# Patient Record
Sex: Female | Born: 1985 | Race: White | Hispanic: No | Marital: Married | Smoking: Never smoker
Health system: Southern US, Community
[De-identification: ages and names within clinical notes are randomized; demographics above are authoritative.]

## PROBLEM LIST (undated history)

## (undated) DIAGNOSIS — E039 Hypothyroidism, unspecified: Secondary | ICD-10-CM

## (undated) DIAGNOSIS — J029 Acute pharyngitis, unspecified: Secondary | ICD-10-CM

## (undated) DIAGNOSIS — F32A Depression, unspecified: Secondary | ICD-10-CM

## (undated) DIAGNOSIS — J329 Chronic sinusitis, unspecified: Secondary | ICD-10-CM

## (undated) DIAGNOSIS — O139 Gestational [pregnancy-induced] hypertension without significant proteinuria, unspecified trimester: Secondary | ICD-10-CM

## (undated) DIAGNOSIS — Z8759 Personal history of other complications of pregnancy, childbirth and the puerperium: Secondary | ICD-10-CM

## (undated) HISTORY — DX: Personal history of other complications of pregnancy, childbirth and the puerperium: Z87.59

## (undated) HISTORY — DX: Gestational (pregnancy-induced) hypertension without significant proteinuria, unspecified trimester: O13.9

## (undated) HISTORY — DX: Acute pharyngitis, unspecified: J02.9

## (undated) HISTORY — DX: Chronic sinusitis, unspecified: J32.9

## (undated) HISTORY — PX: TONSILLECTOMY: SUR1361

## (undated) HISTORY — PX: OTHER SURGICAL HISTORY: SHX169

---
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
## 2007-02-25 ENCOUNTER — Ambulatory Visit: Payer: Self-pay | Admitting: Family Medicine

## 2007-02-25 DIAGNOSIS — J029 Acute pharyngitis, unspecified: Secondary | ICD-10-CM | POA: Insufficient documentation

## 2007-02-25 DIAGNOSIS — J019 Acute sinusitis, unspecified: Secondary | ICD-10-CM | POA: Insufficient documentation

## 2007-02-25 DIAGNOSIS — Z9189 Other specified personal risk factors, not elsewhere classified: Secondary | ICD-10-CM | POA: Insufficient documentation

## 2007-02-25 LAB — CONVERTED CEMR LAB: Rapid Strep: NEGATIVE

## 2010-03-17 ENCOUNTER — Encounter: Payer: Self-pay | Admitting: Family Medicine

## 2010-03-28 ENCOUNTER — Ambulatory Visit (INDEPENDENT_AMBULATORY_CARE_PROVIDER_SITE_OTHER): Payer: 59 | Admitting: Family Medicine

## 2010-03-28 ENCOUNTER — Encounter: Payer: Self-pay | Admitting: Family Medicine

## 2010-03-28 VITALS — BP 90/60 | HR 90 | Temp 98.2°F | Wt 133.0 lb

## 2010-03-28 DIAGNOSIS — K219 Gastro-esophageal reflux disease without esophagitis: Secondary | ICD-10-CM

## 2010-03-28 DIAGNOSIS — N949 Unspecified condition associated with female genital organs and menstrual cycle: Secondary | ICD-10-CM

## 2010-03-28 DIAGNOSIS — E875 Hyperkalemia: Secondary | ICD-10-CM

## 2010-03-28 DIAGNOSIS — R102 Pelvic and perineal pain: Secondary | ICD-10-CM

## 2010-03-28 LAB — BASIC METABOLIC PANEL
BUN: 8 mg/dL (ref 6–23)
CO2: 26 mEq/L (ref 19–32)
Calcium: 8.7 mg/dL (ref 8.4–10.5)
Chloride: 105 mEq/L (ref 96–112)
Creatinine, Ser: 0.6 mg/dL (ref 0.4–1.2)
GFR: 122.93 mL/min (ref >60.00)
Glucose, Bld: 81 mg/dL (ref 70–99)
Potassium: 4 mEq/L (ref 3.5–5.1)
Sodium: 138 mEq/L (ref 135–145)

## 2010-03-28 MED ORDER — OMEPRAZOLE 40 MG PO CPDR: 40.0000 mg | DELAYED_RELEASE_CAPSULE | Freq: Every day | ORAL | Status: DC

## 2010-03-28 NOTE — Progress Notes (Signed)
  Subjective:    Patient ID: Jill Willis, female    DOB: 1985-03-28, 25 y.o.   MRN: [id number]  HPI 25 yr old female to re-establish with [place] after an absence of 3 years and to discuss several issues. 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 Also she asks for a rx for a GERD medication, since she has had more heartburn and reflux symptoms lately. OTC Prilosec helps somewhat. Finally she describes mild LLQ pelvic pains that happen for one or two days out of the month. These seem to come in between her menses each month. Her menses are normal. No problems with BMs or urinations. These have occurred off and on for the past 4 years. She has never had a pelvic exam or Pap smear. Her LMP was 2 weeks ago, and the last of these pains was 4 weeks ago.   Review of Systems  Constitutional: Negative.   Respiratory: Negative.   Cardiovascular: Negative.   Gastrointestinal: Negative.   Genitourinary: Positive for pelvic pain.       Objective:   Physical Exam  Constitutional: She appears well-developed and well-nourished.  Cardiovascular: Normal rate, regular rhythm, normal heart sounds and intact distal pulses.   Pulmonary/Chest: Effort normal and breath sounds normal.  Abdominal: Soft. Bowel sounds are normal. She exhibits no distension and no mass. There is no tenderness. There is no rebound and no guarding.          Assessment & Plan:  The elevated potassium level is probably spurious, but we will recheck a BMET today. I instructed our lab techs to avoid using a tourniquet. We will try 40 mg a day of Omeprazole. The pains she is having are most likely ovulation pains, given the pattern she describes. Use Motrin prn. She does need to be getting yearly GYN exams now, so she will set this up with a GYN of her choice soon.

## 2010-03-31 ENCOUNTER — Telehealth: Payer: Self-pay | Admitting: Family Medicine

## 2010-03-31 ENCOUNTER — Telehealth: Payer: Self-pay

## 2010-03-31 NOTE — Telephone Encounter (Signed)
Pt notified of lab results

## 2010-03-31 NOTE — Telephone Encounter (Signed)
Pt needs blood work results °

## 2010-03-31 NOTE — Telephone Encounter (Signed)
Pt aware of lab results 

## 2010-03-31 NOTE — Telephone Encounter (Signed)
Message copied by Madison Hickman on Thu Mar 31, 2010 12:55 PM ------      Message from: Dwaine Deter      Created: Tue Mar 29, 2010  8:30 AM       Normal, including the potassium

## 2010-04-05 ENCOUNTER — Encounter: Payer: Self-pay | Admitting: Family Medicine

## 2010-05-25 ENCOUNTER — Encounter: Payer: Self-pay | Admitting: Family Medicine

## 2010-05-25 ENCOUNTER — Ambulatory Visit (INDEPENDENT_AMBULATORY_CARE_PROVIDER_SITE_OTHER): Payer: 59 | Admitting: Family Medicine

## 2010-05-25 VITALS — BP 100/62 | HR 78 | Temp 98.3°F | Resp 14 | Wt 134.0 lb

## 2010-05-25 DIAGNOSIS — L509 Urticaria, unspecified: Secondary | ICD-10-CM

## 2010-05-25 NOTE — Progress Notes (Signed)
  Subjective:    Patient ID: Jill Willis, female    DOB: 09-Jun-1985, 25 y.o.   MRN: [id number]  HPI Here for hives which started about one week ago. At first she only had some mild itching over the body that would come and go, but thst suddenly worsened yesterday morning. She developed intense burning and itching in her skin and widespread whelps and wheals over her body that would come and go. No tongue swelling, no wheezing or SOB. She saw the PA at her work yesterday who prescribed a Medrol dose pack and Hydroxyzine. This has helped a lot, and today the visible eruptions are all gone. She still has some mild itching here an there. She denies any new meds or foods, detergents, lotions, etc. She has already gotten an appt with an allergist for a few weeks from now.    Review of Systems  Constitutional: Negative.   Respiratory: Negative.   Cardiovascular: Negative.   Skin: Positive for rash.       Objective:   Physical Exam  Constitutional: She appears well-developed and well-nourished.  Cardiovascular: Normal rate, regular rhythm, normal heart sounds and intact distal pulses.   Pulmonary/Chest: Effort normal and breath sounds normal.  Skin: Skin is warm and dry. No rash noted. No erythema. No pallor.          Assessment & Plan:  This is classic hives. Finish out the Medrol dose pack and instead of Hydroxyzine, I suggested taking Zyrtec daily. Add Zantac OTC bid .

## 2012-03-26 ENCOUNTER — Ambulatory Visit (INDEPENDENT_AMBULATORY_CARE_PROVIDER_SITE_OTHER): Payer: 59 | Admitting: Physician Assistant

## 2012-03-26 VITALS — BP 118/72 | HR 96 | Temp 98.8°F | Resp 17 | Ht 63.5 in | Wt 134.0 lb

## 2012-03-26 DIAGNOSIS — J029 Acute pharyngitis, unspecified: Secondary | ICD-10-CM

## 2012-03-26 LAB — POCT RAPID STREP A (OFFICE): Rapid Strep A Screen: NEGATIVE

## 2012-03-26 NOTE — Progress Notes (Signed)
   [address] [place] [address]   Phone [phone]  Subjective:    Patient ID: Jill Willis, female    DOB: 04/04/1985, 27 y.o.   MRN: [id number]  HPI Pt presents to clinic with sore throat since Sunday early morning.  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  They have several cases of strep at work.  She feels like she has razor blades in her throat.  She is not having any other cold symptoms other than a slight cough once in a while.  She has taken a low dose of Motrin which did help a little with her pain.     Review of Systems  Constitutional: Negative for fever and chills.  HENT: Positive for sore throat. Negative for congestion, rhinorrhea and postnasal drip.   Respiratory: Positive for cough.   Gastrointestinal: Negative for nausea, vomiting and diarrhea.  Musculoskeletal: Negative for myalgias.  Neurological: Negative for headaches.       Objective:   Physical Exam  Vitals reviewed. Constitutional: She is oriented to person, place, and time. She appears well-developed and well-nourished.  HENT:  Head: Normocephalic and atraumatic.  Right Ear: Hearing, tympanic membrane, external ear and ear canal normal.  Left Ear: Hearing, tympanic membrane, external ear and ear canal normal.  Nose: Mucosal edema (red) present.  Mouth/Throat: Uvula is midline and mucous membranes are normal. Posterior oropharyngeal erythema (mild - uvula seems slight swollen and erytheamtous) present. No posterior oropharyngeal edema.  Eyes: Conjunctivae are normal.  Neck: Neck supple.  Cardiovascular: Normal rate, regular rhythm and normal heart sounds.   No murmur heard. Pulmonary/Chest: Effort normal and breath sounds normal.  Lymphadenopathy:    She has cervical adenopathy (AC enlarged but not TTP).  Neurological: She is alert and oriented to person, place, and time.  Skin: Skin is warm and dry.  Psychiatric: She has a normal mood and affect. Her behavior is normal. Judgment and thought  content normal.   Results for orders placed in visit on 03/26/12  POCT RAPID STREP A (OFFICE)      Result Value Range   Rapid Strep A Screen Negative  Negative        Assessment & Plan:  Acute pharyngitis - This appears to be a viral pharyngitis.  She will start Mucinex because she is starting to clear her throat more as the night has gone one.  She will increase her use of Motrin to 800mg  tid for pain.  She will rest and stay out of work tonight and plan on RTW tomorrow.  Plan: POCT rapid strep A

## 2012-03-27 ENCOUNTER — Ambulatory Visit: Payer: 59 | Admitting: Family Medicine

## 2012-03-27 DIAGNOSIS — Z0289 Encounter for other administrative examinations: Secondary | ICD-10-CM

## 2012-08-27 ENCOUNTER — Ambulatory Visit (INDEPENDENT_AMBULATORY_CARE_PROVIDER_SITE_OTHER): Payer: 59 | Admitting: Family Medicine

## 2012-08-27 VITALS — BP 118/72 | HR 84 | Temp 98.2°F | Resp 18 | Ht 62.75 in | Wt 130.8 lb

## 2012-08-27 DIAGNOSIS — J309 Allergic rhinitis, unspecified: Secondary | ICD-10-CM

## 2012-08-27 DIAGNOSIS — J019 Acute sinusitis, unspecified: Secondary | ICD-10-CM

## 2012-08-27 MED ORDER — AMOXICILLIN-POT CLAVULANATE 875-125 MG PO TABS: 1.0000 | ORAL_TABLET | Freq: Two times a day (BID) | ORAL | Status: DC

## 2012-08-27 NOTE — Progress Notes (Signed)
[facility] [address], [place] [address] [phone]  Date:  08/27/2012   Name:  Jill Willis   DOB:  [birth year]   MRN:  [id number]  PCP:  Nelwyn Salisbury, MD    Chief Complaint: Sinusitis   History of Present Illness:  Jill Willis is a 26 y.o. very pleasant female patient who presents with the following:  She is here today with illness.  She has noted sx for about 5 days.  She has noted sinus congestion, sinus pain,nasal  drainage, sore throat.  She does have some sneezing, mild cough.  She is not coughing up any material No fever, chills or aches, no SI symptmos.  LMP 07/30/12  She is generally healthy except she is prone to sinus problems.   She is about to return to full- time school to get her business degree.  Had been working as a Agricultural engineer.  Never a smoker  Patient Active Problem List   Diagnosis Date Noted  . CHICKENPOX, HX OF 02/25/2007    Past Medical History  Diagnosis Date  . Sinusitis   . Chickenpox   . Sore throat     Past Surgical History  Procedure Laterality Date  . Tonsillectomy      History  Substance Use Topics  . Smoking status: Never Smoker   . Smokeless tobacco: Not on file  . Alcohol Use: No    Family History  Problem Relation Age of Onset  . Hypothyroidism Mother   . Diabetes Paternal Grandfather     No Known Allergies  Medication list has been reviewed and updated.  Current Outpatient Prescriptions on File Prior to Visit  Medication Sig Dispense Refill  . norgestimate-ethinyl estradiol (ORTHO-CYCLEN,SPRINTEC,PREVIFEM) 0.25-35 MG-MCG tablet Take 1 tablet by mouth daily.       No current facility-administered medications on file prior to visit.    Review of Systems:  As per HPI- otherwise negative.   Physical Examination: Filed Vitals:   08/27/12 0820  BP: 118/72  Pulse: 84  Temp: 98.2 F (36.8 C)  Resp: 18   Filed Vitals:   08/27/12 0820  Height: 5' 2.75" (1.594 m)   Weight: 130 lb 12.8 oz (59.33 kg)   Body mass index is 23.35 kg/(m^2). Ideal Body Weight: Weight in (lb) to have BMI = 25: 139.7  GEN: WDWN, NAD, Non-toxic, A & O x 3, looks well HEENT: Atraumatic, Normocephalic. Neck supple. No masses, No LAD.  Bilateral TM wnl, oropharynx normal.  PEERL,EOMI.   Nasal cavity is congested, sinus tenderness  Ears and Nose: No external deformity. CV: RRR, No M/G/R. No JVD. No thrill. No extra heart sounds. PULM: CTA B, no wheezes, crackles, rhonchi. No retractions. No resp. distress. No accessory muscle use. ABD: S, NT, ND EXTR: No c/c/e NEURO Normal gait.  PSYCH: Normally interactive. Conversant. Not depressed or anxious appearing.  Calm demeanor.    Assessment and Plan: Sinusitis, acute - Plan: amoxicillin-clavulanate (AUGMENTIN) 875-125 MG per tablet  Allergic rhinitis  Sinusitis: treat with augmentin.  Advised this can interfere with her OCP.   She has zyrtec which she uses sometimes- advised to use regularly for the next few weeks Let me know if not better- Sooner if worse.     Signed Abbe Amsterdam, MD

## 2012-08-27 NOTE — Patient Instructions (Addendum)
Use the antibiotic as directed.  If you are not better in the next few days please let us know. Use your zyrtec for at least the next few weeks, and don't forget an antibiotics can interfere with your oral contraceptive pill.

## 2012-10-12 ENCOUNTER — Ambulatory Visit (INDEPENDENT_AMBULATORY_CARE_PROVIDER_SITE_OTHER): Payer: 59 | Admitting: Family Medicine

## 2012-10-12 VITALS — BP 108/70 | HR 85 | Temp 98.2°F | Resp 16 | Ht 62.5 in | Wt 129.0 lb

## 2012-10-12 DIAGNOSIS — N898 Other specified noninflammatory disorders of vagina: Secondary | ICD-10-CM

## 2012-10-12 DIAGNOSIS — N760 Acute vaginitis: Secondary | ICD-10-CM

## 2012-10-12 DIAGNOSIS — B373 Candidiasis of vulva and vagina: Secondary | ICD-10-CM

## 2012-10-12 DIAGNOSIS — B9689 Other specified bacterial agents as the cause of diseases classified elsewhere: Secondary | ICD-10-CM

## 2012-10-12 LAB — POCT WET PREP WITH KOH
KOH Prep POC: POSITIVE
Trichomonas, UA: NEGATIVE
Yeast Wet Prep HPF POC: POSITIVE

## 2012-10-12 MED ORDER — METRONIDAZOLE 500 MG PO TABS: 500.0000 mg | ORAL_TABLET | Freq: Two times a day (BID) | ORAL | Status: DC

## 2012-10-12 MED ORDER — FLUCONAZOLE 150 MG PO TABS: 150.0000 mg | ORAL_TABLET | Freq: Once | ORAL | Status: DC

## 2012-10-12 NOTE — Progress Notes (Signed)
This chart was scribed for Norberto Sorenson, MD, by Yevette Edwards, ED Scribe. The  patient's care was started at 1:54 PM.  Subjective:    Patient ID: Jill Willis, female    DOB: 28-Jul-1985, 27 y.o.   MRN: [id number] Chief Complaint  Patient presents with  . Vaginal Discharge    * 5 days    Vaginal Discharge The patient's primary symptoms include a vaginal discharge. The patient's pertinent negatives include no pelvic pain. This is a new problem. The current episode started in the past 7 days. The problem has been unchanged. The patient is experiencing no pain. She is not pregnant. Pertinent negatives include no back pain, chills, constipation, diarrhea, dysuria, fever or vomiting. The vaginal discharge was malodorous, yellow and thin. She is sexually active. She uses oral contraceptives for contraception.    HPI Comments: Jill Willis is a 27 y.o. female who presents to the [facility] complaining of vaginal discharge which has been occurring for approximately four days and has an associated fishy ordor. She describes the discharge as "yellow and runny". She denies any dysuria, vaginal pain, or genital sores. She also denies any fever, chills, back pain, diarrhea, or constipation. The pt is sexually active, but she reports that her last sexual intercourse was "a while."  She uses birth control.   The pt had a GYN appointment five days ago, prior to the onset of symptoms.  Past Medical History  Diagnosis Date  . Sinusitis   . Chickenpox   . Sore throat    Current Outpatient Prescriptions on File Prior to Visit  Medication Sig Dispense Refill  . norgestimate-ethinyl estradiol (ORTHO-CYCLEN,SPRINTEC,PREVIFEM) 0.25-35 MG-MCG tablet Take 1 tablet by mouth daily.      Marland Kitchen amoxicillin-clavulanate (AUGMENTIN) 875-125 MG per tablet Take 1 tablet by mouth 2 (two) times daily.  20 tablet  0   No current facility-administered medications on file prior to visit.   No Known Allergies  VS BP 108/70   Pulse 85  Temp(Src) 98.2 F (36.8 C) (Oral)  Resp 16  Ht 5' 2.5" (1.588 m)  Wt 129 lb (58.514 kg)  BMI 23.2 kg/m2  SpO2 100%  LMP 10/01/2012   Review of Systems  Constitutional: Negative for fever and chills.  Gastrointestinal: Negative for vomiting, diarrhea and constipation.  Genitourinary: Positive for vaginal discharge. Negative for dysuria, vaginal pain and pelvic pain.  Musculoskeletal: Negative for back pain.       Objective:   Physical Exam  Nursing note and vitals reviewed. Constitutional: She is oriented to person, place, and time. She appears well-developed and well-nourished. No distress.  HENT:  Head: Normocephalic and atraumatic.  Eyes: EOM are normal.  Neck: Neck supple. No tracheal deviation present.  Cardiovascular: Normal rate.   Pulmonary/Chest: Effort normal. No respiratory distress.  Genitourinary: There is no rash, tenderness, lesion or injury on the right labia. There is no rash, tenderness, lesion or injury on the left labia. Uterus is not deviated, not enlarged, not fixed and not tender. Cervix exhibits discharge. Cervix exhibits no motion tenderness and no friability. Right adnexum displays no mass, no tenderness and no fullness. Left adnexum displays no mass, no tenderness and no fullness. Vaginal discharge found.  Moderate amount of thin, white discharge with an amine order.   Musculoskeletal: Normal range of motion.  Neurological: She is alert and oriented to person, place, and time.  Skin: Skin is warm and dry.  Psychiatric: She has a normal mood and affect. Her behavior is normal.  Assessment & Plan:  Vaginal discharge - Plan: POCT Wet Prep with KOH, GC/Chlamydia Probe Amp  Bacterial vaginosis  Vaginal moniliasis  Meds ordered this encounter  Medications  . fluconazole (DIFLUCAN) 150 MG tablet    Sig: Take 1 tablet (150 mg total) by mouth once. Repeat if needed    Dispense:  2 tablet    Refill:  0  . metroNIDAZOLE (FLAGYL) 500 MG  tablet    Sig: Take 1 tablet (500 mg total) by mouth 2 (two) times daily with a meal. DO NOT CONSUME ALCOHOL WHILE TAKING THIS MEDICATION.    Dispense:  14 tablet    Refill:  0

## 2012-10-12 NOTE — Patient Instructions (Signed)
Monilial Vaginitis Vaginitis in a soreness, swelling and redness (inflammation) of the vagina and vulva. Monilial vaginitis is not a sexually transmitted infection. CAUSES  Yeast vaginitis is caused by yeast (candida) that is normally found in your vagina. With a yeast infection, the candida has overgrown in number to a point that upsets the chemical balance. SYMPTOMS   White, thick vaginal discharge.  Swelling, itching, redness and irritation of the vagina and possibly the lips of the vagina (vulva).  Burning or painful urination.  Painful intercourse. DIAGNOSIS  Things that may contribute to monilial vaginitis are:  Postmenopausal and virginal states.  Pregnancy.  Infections.  Being tired, sick or stressed, especially if you had monilial vaginitis in the past.  Diabetes. Good control will help lower the chance.  Birth control pills.  Tight fitting garments.  Using bubble bath, feminine sprays, douches or deodorant tampons.  Taking certain medications that kill germs (antibiotics).  Sporadic recurrence can occur if you become ill. TREATMENT  Your caregiver will give you medication.  There are several kinds of anti monilial vaginal creams and suppositories specific for monilial vaginitis. For recurrent yeast infections, use a suppository or cream in the vagina 2 times a week, or as directed.  Anti-monilial or steroid cream for the itching or irritation of the vulva may also be used. Get your caregiver's permission.  Painting the vagina with methylene blue solution may help if the monilial cream does not work.  Eating yogurt may help prevent monilial vaginitis. HOME CARE INSTRUCTIONS   Finish all medication as prescribed.  Do not have sex until treatment is completed or after your caregiver tells you it is okay.  Take warm sitz baths.  Do not douche.  Do not use tampons, especially scented ones.  Wear cotton underwear.  Avoid tight pants and panty  hose.  Tell your sexual partner that you have a yeast infection. They should go to their caregiver if they have symptoms such as mild rash or itching.  Your sexual partner should be treated as well if your infection is difficult to eliminate.  Practice safer sex. Use condoms.  Some vaginal medications cause latex condoms to fail. Vaginal medications that harm condoms are:  Cleocin cream.  Butoconazole (Femstat).  Terconazole (Terazol) vaginal suppository.  Miconazole (Monistat) (may be purchased over the counter). SEEK MEDICAL CARE IF:   You have a temperature by mouth above 102 F (38.9 C).  The infection is getting worse after 2 days of treatment.  The infection is not getting better after 3 days of treatment.  You develop blisters in or around your vagina.  You develop vaginal bleeding, and it is not your menstrual period.  You have pain when you urinate.  You develop intestinal problems.  You have pain with sexual intercourse. Document Released: 09/28/2004 Document Revised: 03/13/2011 Document Reviewed: 06/12/2008 ExitCare Patient Information 2014 [employer]. Bacterial Vaginosis Bacterial vaginosis (BV) is a vaginal infection where the normal balance of bacteria in the vagina is disrupted. The normal balance is then replaced by an overgrowth of certain bacteria. There are several different kinds of bacteria that can cause BV. BV is the most common vaginal infection in women of childbearing age. CAUSES   The cause of BV is not fully understood. BV develops when there is an increase or imbalance of harmful bacteria.  Some activities or behaviors can upset the normal balance of bacteria in the vagina and put women at increased risk including:  Having a new sex partner or multiple   sex partners.  Douching.  Using an intrauterine device (IUD) for contraception.  It is not clear what role sexual activity plays in the development of BV. However, women that have  never had sexual intercourse are rarely infected with BV. Women do not get BV from toilet seats, bedding, swimming pools or from touching objects around them.  SYMPTOMS   Grey vaginal discharge.  A fish-like odor with discharge, especially after sexual intercourse.  Itching or burning of the vagina and vulva.  Burning or pain with urination.  Some women have no signs or symptoms at all. DIAGNOSIS  Your caregiver must examine the vagina for signs of BV. Your caregiver will perform lab tests and look at the sample of vaginal fluid through a microscope. They will look for bacteria and abnormal cells (clue cells), a pH test higher than 4.5, and a positive amine test all associated with BV.  RISKS AND COMPLICATIONS   Pelvic inflammatory disease (PID).  Infections following gynecology surgery.  Developing HIV.  Developing herpes virus. TREATMENT  Sometimes BV will clear up without treatment. However, all women with symptoms of BV should be treated to avoid complications, especially if gynecology surgery is planned. Female partners generally do not need to be treated. However, BV may spread between female sex partners so treatment is helpful in preventing a recurrence of BV.   BV may be treated with antibiotics. The antibiotics come in either pill or vaginal cream forms. Either can be used with nonpregnant or pregnant women, but the recommended dosages differ. These antibiotics are not harmful to the baby.  BV can recur after treatment. If this happens, a second round of antibiotics will often be prescribed.  Treatment is important for pregnant women. If not treated, BV can cause a premature delivery, especially for a pregnant woman who had a premature birth in the past. All pregnant women who have symptoms of BV should be checked and treated.  For chronic reoccurrence of BV, treatment with a type of prescribed gel vaginally twice a week is helpful. HOME CARE INSTRUCTIONS   Finish all  medication as directed by your caregiver.  Do not have sex until treatment is completed.  Tell your sexual partner that you have a vaginal infection. They should see their caregiver and be treated if they have problems, such as a mild rash or itching.  Practice safe sex. Use condoms. Only have 1 sex partner. PREVENTION  Basic prevention steps can help reduce the risk of upsetting the natural balance of bacteria in the vagina and developing BV:  Do not have sexual intercourse (be abstinent).  Do not douche.  Use all of the medicine prescribed for treatment of BV, even if the signs and symptoms go away.  Tell your sex partner if you have BV. That way, they can be treated, if needed, to prevent reoccurrence. SEEK MEDICAL CARE IF:   Your symptoms are not improving after 3 days of treatment.  You have increased discharge, pain, or fever. MAKE SURE YOU:   Understand these instructions.  Will watch your condition.  Will get help right away if you are not doing well or get worse. FOR MORE INFORMATION  Division of STD Prevention (DSTDP), Centers for Disease Control and Prevention: www.cdc.gov/std American Social Health Association (ASHA): www.ashastd.org  Document Released: 12/19/2004 Document Revised: 03/13/2011 Document Reviewed: 06/11/2008 ExitCare Patient Information 2014 [employer].  

## 2012-10-15 ENCOUNTER — Encounter: Payer: Self-pay | Admitting: Family Medicine

## 2013-04-02 ENCOUNTER — Ambulatory Visit (HOSPITAL_BASED_OUTPATIENT_CLINIC_OR_DEPARTMENT_OTHER): Admission: RE | Admit: 2013-04-02 | Payer: 59 | Source: Ambulatory Visit | Admitting: Otolaryngology

## 2013-04-02 ENCOUNTER — Encounter (HOSPITAL_BASED_OUTPATIENT_CLINIC_OR_DEPARTMENT_OTHER): Admission: RE | Payer: Self-pay | Source: Ambulatory Visit

## 2013-04-02 SURGERY — SEPTOPLASTY, NOSE, WITH NASAL TURBINATE REDUCTION
Anesthesia: General

## 2013-07-03 ENCOUNTER — Ambulatory Visit (INDEPENDENT_AMBULATORY_CARE_PROVIDER_SITE_OTHER): Payer: BC Managed Care – PPO | Admitting: Family Medicine

## 2013-07-03 VITALS — BP 114/82 | HR 78 | Temp 98.6°F | Resp 18 | Ht 66.0 in | Wt 130.8 lb

## 2013-07-03 DIAGNOSIS — H109 Unspecified conjunctivitis: Secondary | ICD-10-CM

## 2013-07-03 MED ORDER — MOXIFLOXACIN HCL (2X DAY) 0.5 % OP SOLN: 1.0000 [drp] | Freq: Two times a day (BID) | OPHTHALMIC | Status: DC

## 2013-07-03 MED ORDER — POLYMYXIN B-TRIMETHOPRIM 10000-0.1 UNIT/ML-% OP SOLN: 1.0000 [drp] | OPHTHALMIC | Status: DC

## 2013-07-03 NOTE — Patient Instructions (Signed)
It appears that you have conjunctivitis (pink-eye). Use the moxeza drops twice a day for one week.  However if this is too expensive use the polytrim rx instead.  Let me know if your eye is not better in the next 1-2 days- Sooner if worse.

## 2013-07-03 NOTE — Progress Notes (Signed)
[facility] [address], [place] [address] [phone]  Date:  07/03/2013   Name:  Jill Willis   DOB:  [birth year]   MRN:  [id number]  PCP:  Nelwyn SalisburyFRY,STEPHEN A, MD    Chief Complaint: eye pain   History of Present Illness:  Jill Willis is a 28 y.o. very pleasant female patient who presents with the following:  She noted a problem with her right eye a few hours ago.  It just started bothering her a few hours ago.  It does not feel like there is anything in her eye.  She has noted some blurriness.  She did not have any discharge this am, but noted onset of green discharge from the eye this afternoon The left eye is ok.   She does not wear corrective lenses.    She is generally healthy   Patient Active Problem List   Diagnosis Date Noted  . CHICKENPOX, HX OF 02/25/2007    Past Medical History  Diagnosis Date  . Sinusitis   . Chickenpox   . Sore throat     Past Surgical History  Procedure Laterality Date  . Tonsillectomy      History  Substance Use Topics  . Smoking status: Never Smoker   . Smokeless tobacco: Not on file  . Alcohol Use: No    Family History  Problem Relation Age of Onset  . Hypothyroidism Mother   . Diabetes Paternal Grandfather     No Known Allergies  Medication list has been reviewed and updated.  Current Outpatient Prescriptions on File Prior to Visit  Medication Sig Dispense Refill  . norgestimate-ethinyl estradiol (ORTHO-CYCLEN,SPRINTEC,PREVIFEM) 0.25-35 MG-MCG tablet Take 1 tablet by mouth daily.      . fluconazole (DIFLUCAN) 150 MG tablet Take 1 tablet (150 mg total) by mouth once. Repeat if needed  2 tablet  0  . metroNIDAZOLE (FLAGYL) 500 MG tablet Take 1 tablet (500 mg total) by mouth 2 (two) times daily with a meal. DO NOT CONSUME ALCOHOL WHILE TAKING THIS MEDICATION.  14 tablet  0   No current facility-administered medications on file prior to visit.    Review of Systems:  As per HPI-  otherwise negative.   Physical Examination: Filed Vitals:   07/03/13 2038  BP: 114/82  Pulse: 78  Temp: 98.6 F (37 C)  Resp: 18   Filed Vitals:   07/03/13 2038  Height: 5\' 6"  (1.676 m)  Weight: 130 lb 12.8 oz (59.33 kg)   Body mass index is 21.12 kg/(m^2). Ideal Body Weight: Weight in (lb) to have BMI = 25: 154.6  GEN: WDWN, NAD, Non-toxic, A & O x 3, looks well HEENT: Atraumatic, Normocephalic. Neck supple. No masses, No LAD.  Bilateral TM wnl, oropharynx normal.  PEERL,EOMI.   Right eye is slightly injected with a small amount of green discharge.  Negative fluorescin stain Ears and Nose: No external deformity. CV: RRR, No M/G/R. No JVD. No thrill. No extra heart sounds. PULM: CTA B, no wheezes, crackles, rhonchi. No retractions. No resp. distress. No accessory muscle use. EXTR: No c/c/e NEURO Normal gait.  PSYCH: Normally interactive. Conversant. Not depressed or anxious appearing.  Calm demeanor.   Assessment and Plan: Conjunctivitis, right eye - Plan: Moxifloxacin HCl 0.5 % SOLN, trimethoprim-polymyxin b (POLYTRIM) ophthalmic solution  Treat for conjunctivitis with moxeza- if too expensive she can use polytrim.    Signed Abbe AmsterdamJessica Copland, MD

## 2014-12-03 ENCOUNTER — Ambulatory Visit (INDEPENDENT_AMBULATORY_CARE_PROVIDER_SITE_OTHER): Payer: BLUE CROSS/BLUE SHIELD | Admitting: Family Medicine

## 2014-12-03 ENCOUNTER — Encounter: Payer: Self-pay | Admitting: Family Medicine

## 2014-12-03 VITALS — BP 115/82 | HR 75 | Temp 99.1°F | Ht 66.0 in | Wt 139.0 lb

## 2014-12-03 DIAGNOSIS — G43809 Other migraine, not intractable, without status migrainosus: Secondary | ICD-10-CM | POA: Diagnosis not present

## 2014-12-03 LAB — HEPATIC FUNCTION PANEL
ALBUMIN: 4.6 g/dL (ref 3.5–5.2)
ALT: 14 U/L (ref 0–35)
AST: 16 U/L (ref 0–37)
Alkaline Phosphatase: 66 U/L (ref 39–117)
BILIRUBIN TOTAL: 0.6 mg/dL (ref 0.2–1.2)
Bilirubin, Direct: 0.1 mg/dL (ref 0.0–0.3)
Total Protein: 6.7 g/dL (ref 6.0–8.3)

## 2014-12-03 LAB — BASIC METABOLIC PANEL
BUN: 10 mg/dL (ref 6–23)
CALCIUM: 9.5 mg/dL (ref 8.4–10.5)
CO2: 28 mEq/L (ref 19–32)
CREATININE: 0.69 mg/dL (ref 0.40–1.20)
Chloride: 105 mEq/L (ref 96–112)
GFR: 108.44 mL/min (ref >60.00)
GLUCOSE: 85 mg/dL (ref 70–99)
Potassium: 4.5 mEq/L (ref 3.5–5.1)
Sodium: 141 mEq/L (ref 135–145)

## 2014-12-03 LAB — CBC WITH DIFFERENTIAL/PLATELET
Basophils Absolute: 0 10*3/uL (ref 0.0–0.1)
Basophils Relative: 0.4 % (ref 0.0–3.0)
EOS PCT: 0.3 % (ref 0.0–5.0)
Eosinophils Absolute: 0 10*3/uL (ref 0.0–0.7)
HCT: 43.2 % (ref 36.0–46.0)
HEMOGLOBIN: 14.3 g/dL (ref 12.0–15.0)
Lymphocytes Relative: 26.5 % (ref 12.0–46.0)
Lymphs Abs: 1.9 10*3/uL (ref 0.7–4.0)
MCHC: 33.1 g/dL (ref 30.0–36.0)
MCV: 92 fl (ref 78.0–100.0)
MONOS PCT: 5.7 % (ref 3.0–12.0)
Monocytes Absolute: 0.4 10*3/uL (ref 0.1–1.0)
Neutro Abs: 4.8 10*3/uL (ref 1.4–7.7)
Neutrophils Relative %: 67.1 % (ref 43.0–77.0)
Platelets: 233 10*3/uL (ref 150.0–400.0)
RBC: 4.7 Mil/uL (ref 3.87–5.11)
RDW: 13.9 % (ref 11.5–15.5)
WBC: 7.2 10*3/uL (ref 4.0–10.5)

## 2014-12-03 LAB — TSH: TSH: 0.61 u[IU]/mL (ref 0.35–4.50)

## 2014-12-03 MED ORDER — SUMATRIPTAN SUCCINATE 100 MG PO TABS: 100.0000 mg | ORAL_TABLET | ORAL | Status: DC | PRN

## 2014-12-03 NOTE — Progress Notes (Signed)
   Subjective:    Patient ID: Jill Willis, female    DOB: Aug 16, 1985, 29 y.o.   MRN: [id number]  HPI 29 yr old female to re-establish with us and to discuss frequent headaches. These started about 6 months ago and they have become more frequent. They seem to be related to the start of each menstrual cycle, beginning about 2 days before she starts and lasting 2-4 days. These are centered over the forehead. They are uncomfortable but not severe. She has some light sensitivity with them, and she can be nauseated at times. No visual changes, no other neurologic symptoms. She has tried Ibuprofen and Excedrin Migraine with no relief. She had been taking Ortho Tricyclen for several years but she stopped taking this 4 months ago. Her menses are somewhat irregular now and they last about 3-4 days each. She recently got married but her husband has had a vasectomy, so she does not need to take BCP for contraception. Of note her mother has had migraines since she was in her  3520s.   Review of Systems  Constitutional: Negative.   Eyes: Negative.   Respiratory: Negative.   Cardiovascular: Negative.   Neurological: Positive for headaches. Negative for dizziness, tremors, seizures, syncope, facial asymmetry, speech difficulty, weakness, light-headedness and numbness.       Objective:   Physical Exam  Constitutional: She is oriented to person, place, and time. She appears well-developed and well-nourished.  HENT:  Head: Normocephalic and atraumatic.  Right Ear: External ear normal.  Left Ear: External ear normal.  Nose: Nose normal.  Mouth/Throat: Oropharynx is clear and moist.  Eyes: Conjunctivae and EOM are normal. Pupils are equal, round, and reactive to light.  Neck: Neck supple. No JVD present. No tracheal deviation present. No thyromegaly present.  Cardiovascular: Normal rate, regular rhythm, normal heart sounds and intact distal pulses.   Pulmonary/Chest: Effort normal and breath sounds  normal.  Lymphadenopathy:    She has no cervical adenopathy.  Neurological: She is alert and oriented to person, place, and time. She has normal reflexes. No cranial nerve deficit. She exhibits normal muscle tone. Coordination normal.          Assessment & Plan:  These sound like perimenstrual migraines. She will try Imitrex as needed. We reviewed dietary measures to avoid. She has not seen a GYN for several years so she made an appt to establish with Dr. Claiborne Billingsallahan next month.

## 2014-12-03 NOTE — Progress Notes (Signed)
Pre visit review using our clinic review tool, if applicable. No additional management support is needed unless otherwise documented below in the visit note. 

## 2015-11-03 DIAGNOSIS — Z6827 Body mass index (BMI) 27.0-27.9, adult: Secondary | ICD-10-CM | POA: Diagnosis not present

## 2015-11-03 DIAGNOSIS — Z01419 Encounter for gynecological examination (general) (routine) without abnormal findings: Secondary | ICD-10-CM | POA: Diagnosis not present

## 2015-12-14 DIAGNOSIS — R05 Cough: Secondary | ICD-10-CM | POA: Diagnosis not present

## 2015-12-14 DIAGNOSIS — J0100 Acute maxillary sinusitis, unspecified: Secondary | ICD-10-CM | POA: Diagnosis not present

## 2015-12-14 DIAGNOSIS — T3695XA Adverse effect of unspecified systemic antibiotic, initial encounter: Secondary | ICD-10-CM | POA: Diagnosis not present

## 2015-12-14 DIAGNOSIS — B379 Candidiasis, unspecified: Secondary | ICD-10-CM | POA: Diagnosis not present

## 2016-05-03 DIAGNOSIS — J988 Other specified respiratory disorders: Secondary | ICD-10-CM | POA: Diagnosis not present

## 2016-05-03 DIAGNOSIS — H66002 Acute suppurative otitis media without spontaneous rupture of ear drum, left ear: Secondary | ICD-10-CM | POA: Diagnosis not present

## 2016-05-03 DIAGNOSIS — J3489 Other specified disorders of nose and nasal sinuses: Secondary | ICD-10-CM | POA: Diagnosis not present

## 2016-05-15 DIAGNOSIS — Z6826 Body mass index (BMI) 26.0-26.9, adult: Secondary | ICD-10-CM | POA: Diagnosis not present

## 2016-05-15 DIAGNOSIS — Z3162 Encounter for fertility preservation counseling: Secondary | ICD-10-CM | POA: Diagnosis not present

## 2016-08-03 ENCOUNTER — Encounter: Payer: Self-pay | Admitting: Family Medicine

## 2016-08-03 ENCOUNTER — Ambulatory Visit (INDEPENDENT_AMBULATORY_CARE_PROVIDER_SITE_OTHER): Payer: BLUE CROSS/BLUE SHIELD | Admitting: Family Medicine

## 2016-08-03 VITALS — BP 106/84 | HR 78 | Temp 98.4°F | Ht 66.0 in | Wt 141.0 lb

## 2016-08-03 DIAGNOSIS — Z0000 Encounter for general adult medical examination without abnormal findings: Secondary | ICD-10-CM

## 2016-08-03 LAB — POC URINALSYSI DIPSTICK (AUTOMATED)
Bilirubin, UA: NEGATIVE
Blood, UA: NEGATIVE
CLARITY UA: NEGATIVE
Glucose, UA: NEGATIVE
KETONES UA: NEGATIVE
Leukocytes, UA: NEGATIVE
Nitrite, UA: NEGATIVE
Protein, UA: NEGATIVE
SPEC GRAV UA: 1.015 (ref 1.010–1.025)
UROBILINOGEN UA: 0.2 U/dL
pH, UA: 7 (ref 5.0–8.0)

## 2016-08-03 LAB — BASIC METABOLIC PANEL
BUN: 7 mg/dL (ref 6–23)
CALCIUM: 9.1 mg/dL (ref 8.4–10.5)
CHLORIDE: 104 meq/L (ref 96–112)
CO2: 29 meq/L (ref 19–32)
CREATININE: 0.69 mg/dL (ref 0.40–1.20)
GFR: 107.13 mL/min (ref >60.00)
GLUCOSE: 92 mg/dL (ref 70–99)
Potassium: 4.3 mEq/L (ref 3.5–5.1)
Sodium: 139 mEq/L (ref 135–145)

## 2016-08-03 LAB — CBC WITH DIFFERENTIAL/PLATELET
BASOS PCT: 0.7 % (ref 0.0–3.0)
Basophils Absolute: 0 10*3/uL (ref 0.0–0.1)
Eosinophils Absolute: 0 10*3/uL (ref 0.0–0.7)
Eosinophils Relative: 0.3 % (ref 0.0–5.0)
HCT: 38.5 % (ref 36.0–46.0)
HEMOGLOBIN: 12.7 g/dL (ref 12.0–15.0)
Lymphocytes Relative: 26.4 % (ref 12.0–46.0)
Lymphs Abs: 1.8 10*3/uL (ref 0.7–4.0)
MCHC: 33 g/dL (ref 30.0–36.0)
MCV: 93.3 fl (ref 78.0–100.0)
MONO ABS: 0.5 10*3/uL (ref 0.1–1.0)
Monocytes Relative: 6.5 % (ref 3.0–12.0)
NEUTROS ABS: 4.6 10*3/uL (ref 1.4–7.7)
Neutrophils Relative %: 66.1 % (ref 43.0–77.0)
Platelets: 224 10*3/uL (ref 150.0–400.0)
RBC: 4.13 Mil/uL (ref 3.87–5.11)
RDW: 13.8 % (ref 11.5–15.5)
WBC: 7 10*3/uL (ref 4.0–10.5)

## 2016-08-03 LAB — HEPATIC FUNCTION PANEL
ALBUMIN: 4.4 g/dL (ref 3.5–5.2)
ALT: 16 U/L (ref 0–35)
AST: 19 U/L (ref 0–37)
Alkaline Phosphatase: 57 U/L (ref 39–117)
BILIRUBIN DIRECT: 0.1 mg/dL (ref 0.0–0.3)
TOTAL PROTEIN: 6.1 g/dL (ref 6.0–8.3)
Total Bilirubin: 0.6 mg/dL (ref 0.2–1.2)

## 2016-08-03 LAB — LIPID PANEL
Cholesterol: 195 mg/dL (ref 0–200)
HDL: 67.3 mg/dL (ref >39.00)
LDL Cholesterol: 118 mg/dL — ABNORMAL HIGH (ref 0–99)
NONHDL: 127.85
TRIGLYCERIDES: 51 mg/dL (ref 0.0–149.0)
Total CHOL/HDL Ratio: 3
VLDL: 10.2 mg/dL (ref 0.0–40.0)

## 2016-08-03 LAB — TSH: TSH: 0.58 u[IU]/mL (ref 0.35–4.50)

## 2016-08-03 NOTE — Progress Notes (Signed)
   Subjective:    Patient ID: Jill Willis, female    DOB: 07-02-1985, 31 y.o.   MRN: [id number]  HPI Here for a well exam. She feels fine. Her headaches have improved and she no longer uses Sumatriptan.    Review of Systems  Constitutional: Negative.   HENT: Negative.   Eyes: Negative.   Respiratory: Negative.   Cardiovascular: Negative.   Gastrointestinal: Negative.   Genitourinary: Negative for decreased urine volume, difficulty urinating, dyspareunia, dysuria, enuresis, flank pain, frequency, hematuria, pelvic pain and urgency.  Musculoskeletal: Negative.   Skin: Negative.   Neurological: Negative.   Psychiatric/Behavioral: Negative.        Objective:   Physical Exam  Constitutional: She is oriented to person, place, and time. She appears well-developed and well-nourished. No distress.  HENT:  Head: Normocephalic and atraumatic.  Right Ear: External ear normal.  Left Ear: External ear normal.  Nose: Nose normal.  Mouth/Throat: Oropharynx is clear and moist. No oropharyngeal exudate.  Eyes: Pupils are equal, round, and reactive to light. Conjunctivae and EOM are normal. No scleral icterus.  Neck: Normal range of motion. Neck supple. No JVD present. No thyromegaly present.  Cardiovascular: Normal rate, regular rhythm, normal heart sounds and intact distal pulses.  Exam reveals no gallop and no friction rub.   No murmur heard. Pulmonary/Chest: Effort normal and breath sounds normal. No respiratory distress. She has no wheezes. She has no rales. She exhibits no tenderness.  Abdominal: Soft. Bowel sounds are normal. She exhibits no distension and no mass. There is no tenderness. There is no rebound and no guarding.  Musculoskeletal: Normal range of motion. She exhibits no edema or tenderness.  Lymphadenopathy:    She has no cervical adenopathy.  Neurological: She is alert and oriented to person, place, and time. She has normal reflexes. No cranial nerve deficit. She  exhibits normal muscle tone. Coordination normal.  Skin: Skin is warm and dry. No rash noted. No erythema.  Psychiatric: She has a normal mood and affect. Her behavior is normal. Judgment and thought content normal.          Assessment & Plan:  Well exam. We discussed diet and exercise. Get fasting labs.  Gershon CraneStephen Yacine Droz, MD

## 2016-08-03 NOTE — Patient Instructions (Signed)
WE NOW OFFER   [facility] FAST TRACK!!!  SAME DAY Appointments for ACUTE CARE  Such as: Sprains, Injuries, cuts, abrasions, rashes, muscle pain, joint pain, back pain Colds, flu, sore throats, headache, allergies, cough, fever  Ear pain, sinus and eye infections Abdominal pain, nausea, vomiting, diarrhea, upset stomach Animal/insect bites  3 Easy Ways to Schedule: Walk-In Scheduling Call in scheduling Mychart Sign-up: [email or web address]         

## 2016-09-13 ENCOUNTER — Ambulatory Visit (INDEPENDENT_AMBULATORY_CARE_PROVIDER_SITE_OTHER): Payer: BLUE CROSS/BLUE SHIELD | Admitting: Orthopaedic Surgery

## 2016-09-13 ENCOUNTER — Ambulatory Visit (INDEPENDENT_AMBULATORY_CARE_PROVIDER_SITE_OTHER): Payer: BLUE CROSS/BLUE SHIELD

## 2016-09-13 ENCOUNTER — Ambulatory Visit (INDEPENDENT_AMBULATORY_CARE_PROVIDER_SITE_OTHER): Payer: BLUE CROSS/BLUE SHIELD | Admitting: Family Medicine

## 2016-09-13 ENCOUNTER — Encounter: Payer: Self-pay | Admitting: Family Medicine

## 2016-09-13 VITALS — BP 105/89 | HR 83 | Temp 98.4°F | Ht 66.0 in | Wt 141.0 lb

## 2016-09-13 DIAGNOSIS — M79674 Pain in right toe(s): Secondary | ICD-10-CM

## 2016-09-13 MED ORDER — METHYLPREDNISOLONE ACETATE 40 MG/ML IJ SUSP
40.0000 mg | INTRAMUSCULAR | Status: AC | PRN
  Administered 2016-09-13: 40 mg via INTRAMUSCULAR

## 2016-09-13 MED ORDER — LIDOCAINE HCL 1 % IJ SOLN
1.0000 mL | INTRAMUSCULAR | Status: AC | PRN
  Administered 2016-09-13: 1 mL

## 2016-09-13 NOTE — Progress Notes (Signed)
Office Visit Note   Patient: Jill Willis           Date of Birth: 16-May-1985           MRN: [id number] Visit Date: 09/13/2016              Requested by: Jill SalisburyFry, Stephen A, MD [address] [place], [place] [address] PCP: Jill SalisburyFry, Stephen A, MD   Assessment & Plan: Visit Diagnoses:  1. Pain of right great toe     Plan: Although x-rays did not show hallux rigidus she presents as if this is something that starting to happen. I do want her to wear her flat shoes for the next month or so. I will have her take 600 mg of ibuprofen twice daily for the next week. I agree with her trying Willis steroid injection around the MTP joint without difficulty but mainly in the soft tissues and not likely in the joint itself.. If this does not work I would like to try an intra-articular injection under direct fluoroscopy of the MTP joint. In the office after I did provide an injection she felt significantly better over this will help her at all questions were encouraged and answered and she knows to give us Willis call if this worsens.  Follow-Up Instructions: Return if symptoms worsen or fail to improve.   Orders:  Orders Placed This Encounter  Procedures  . Trigger Point Injection  . XR Toe Great Right   No orders of the defined types were placed in this encounter.     Procedures: Trigger Point Inj Date/Time: 09/13/2016 4:39 PM Performed by: Kathryne HitchBLACKMAN, Ikia Cincotta Y Authorized by: Kathryne HitchBLACKMAN, Donis Pinder Y   Total # of Trigger Points:  1 Location: lower extremity   Medications #1:  1 mL lidocaine 1 %; 40 mg methylPREDNISolone acetate 40 MG/ML     Clinical Data: No additional findings.   Subjective: No chief complaint on file. Patient is Willis very pleasant 31 year old I'm seeing for the first time as Willis patient but I know her and her family well. Her mom has had Willis history of hallux rigidus and had surgery for this. The patient does describe great toe pain that is worsened quite Willis bit over  the last 24 hours but is been bothering her for about 6-8 months now. She points to her right great toe as Willis source of her pain and she points the MTP joint. When she wears flat shoes and firm shoes she has less pain. It affects her worse when going up and down stairs as well. Is again become significantly painful enough today that she wanted be seen.  HPI  Review of Systems She currently denies any headache, chest pain, shortness of breath, fever, chills, nausea, vomiting.  Objective: Vital Signs: There were no vitals taken for this visit.  Physical Exam She is alert or 3 and in no acute distress she is walking though with slight limp. Ortho Exam Examination of her right foot shows normal contours of the foot. There is no swelling or redness around the MTP joint no bunion deformity. She is neurovascular intact. Flexion of the MTP joint of the right great 2 does show severe pain. Specialty Comments:  No specialty comments available.  Imaging: Xr Toe Great Right  Result Date: 09/13/2016 3 views of the right great toe show no acute findings. The alignment of the  MTP joint is near anatomic.    PMFS History: Patient Active Problem List   Diagnosis Date  Noted  . Pain of right great toe 09/13/2016  . CHICKENPOX, HX OF 02/25/2007   Past Medical History:  Diagnosis Date  . Chickenpox   . Sinusitis   . Sore throat     Family History  Problem Relation Age of Onset  . Hypothyroidism Mother   . Diabetes Paternal Grandfather     Past Surgical History:  Procedure Laterality Date  . TONSILLECTOMY     Social History   Occupational History  . Not on file.   Social History Main Topics  . Smoking status: Never Smoker  . Smokeless tobacco: Never Used  . Alcohol use 0.0 oz/week     Comment: very little  . Drug use: No  . Sexual activity: Yes    Birth control/ protection: Pill

## 2016-09-13 NOTE — Progress Notes (Signed)
   Subjective:    Patient ID: Jill BoerKatelyn Gaulden Urwin, female    DOB: August 13, 1985, 31 y.o.   MRN: [id number]  HPI Here for 6 months of intermittent pain at the base of the right great toe. No hx of trauma. Wearing upportive shoes helps. It never swells or gest red or warm. She normally runs for exercise but this has stopped her from running.    Review of Systems  Constitutional: Negative.   Musculoskeletal: Positive for arthralgias. Negative for joint swelling.       Objective:   Physical Exam  Constitutional: She appears well-developed and well-nourished.  Walks with a limp  Musculoskeletal:  The right great toe is very tender over the MTP and there is mild swelling. ROM is full. No crepitus. No erythema or warmth.           Assessment & Plan:  Toe pain, probable capsulitis. Stay off the foot, use ice several times a day, take Ibuprofen prn. We will refer here to Podiatry.  Gershon CraneStephen Jarmel Linhardt, MD

## 2016-09-13 NOTE — Patient Instructions (Signed)
WE NOW OFFER   [facility] FAST TRACK!!!  SAME DAY Appointments for ACUTE CARE  Such as: Sprains, Injuries, cuts, abrasions, rashes, muscle pain, joint pain, back pain Colds, flu, sore throats, headache, allergies, cough, fever  Ear pain, sinus and eye infections Abdominal pain, nausea, vomiting, diarrhea, upset stomach Animal/insect bites  3 Easy Ways to Schedule: Walk-In Scheduling Call in scheduling Mychart Sign-up: [email or web address]         

## 2016-09-15 ENCOUNTER — Ambulatory Visit: Payer: Self-pay | Admitting: Podiatry

## 2016-09-21 ENCOUNTER — Encounter: Payer: Self-pay | Admitting: Family Medicine

## 2016-10-18 DIAGNOSIS — B379 Candidiasis, unspecified: Secondary | ICD-10-CM | POA: Diagnosis not present

## 2016-10-18 DIAGNOSIS — T3695XA Adverse effect of unspecified systemic antibiotic, initial encounter: Secondary | ICD-10-CM | POA: Diagnosis not present

## 2016-10-18 DIAGNOSIS — J0120 Acute ethmoidal sinusitis, unspecified: Secondary | ICD-10-CM | POA: Diagnosis not present

## 2016-11-06 DIAGNOSIS — Z6826 Body mass index (BMI) 26.0-26.9, adult: Secondary | ICD-10-CM | POA: Diagnosis not present

## 2016-11-06 DIAGNOSIS — J0101 Acute recurrent maxillary sinusitis: Secondary | ICD-10-CM | POA: Diagnosis not present

## 2016-11-06 DIAGNOSIS — Z01419 Encounter for gynecological examination (general) (routine) without abnormal findings: Secondary | ICD-10-CM | POA: Diagnosis not present

## 2016-11-24 DIAGNOSIS — H66002 Acute suppurative otitis media without spontaneous rupture of ear drum, left ear: Secondary | ICD-10-CM | POA: Diagnosis not present

## 2016-11-24 DIAGNOSIS — J988 Other specified respiratory disorders: Secondary | ICD-10-CM | POA: Diagnosis not present

## 2016-12-18 DIAGNOSIS — J069 Acute upper respiratory infection, unspecified: Secondary | ICD-10-CM | POA: Diagnosis not present

## 2017-03-09 ENCOUNTER — Other Ambulatory Visit: Payer: Self-pay

## 2017-03-09 ENCOUNTER — Emergency Department (HOSPITAL_COMMUNITY)
Admission: EM | Admit: 2017-03-09 | Discharge: 2017-03-09 | Disposition: A | Discharge status: Home / Self Care | Payer: BLUE CROSS/BLUE SHIELD | Attending: Emergency Medicine | Admitting: Emergency Medicine

## 2017-03-09 ENCOUNTER — Encounter (HOSPITAL_COMMUNITY): Payer: Self-pay | Admitting: Emergency Medicine

## 2017-03-09 ENCOUNTER — Ambulatory Visit: Payer: Self-pay | Admitting: *Deleted

## 2017-03-09 ENCOUNTER — Emergency Department (HOSPITAL_COMMUNITY): Payer: BLUE CROSS/BLUE SHIELD

## 2017-03-09 DIAGNOSIS — R0781 Pleurodynia: Secondary | ICD-10-CM | POA: Diagnosis not present

## 2017-03-09 DIAGNOSIS — Y9389 Activity, other specified: Secondary | ICD-10-CM | POA: Insufficient documentation

## 2017-03-09 DIAGNOSIS — R0789 Other chest pain: Secondary | ICD-10-CM

## 2017-03-09 DIAGNOSIS — Y92410 Unspecified street and highway as the place of occurrence of the external cause: Secondary | ICD-10-CM | POA: Insufficient documentation

## 2017-03-09 DIAGNOSIS — R0602 Shortness of breath: Secondary | ICD-10-CM | POA: Diagnosis not present

## 2017-03-09 DIAGNOSIS — R100 Acute abdomen: Secondary | ICD-10-CM | POA: Diagnosis not present

## 2017-03-09 DIAGNOSIS — S20309A Unspecified superficial injuries of unspecified front wall of thorax, initial encounter: Secondary | ICD-10-CM | POA: Diagnosis present

## 2017-03-09 DIAGNOSIS — R109 Unspecified abdominal pain: Secondary | ICD-10-CM | POA: Diagnosis not present

## 2017-03-09 DIAGNOSIS — S2222XA Fracture of body of sternum, initial encounter for closed fracture: Secondary | ICD-10-CM | POA: Diagnosis not present

## 2017-03-09 DIAGNOSIS — S2220XA Unspecified fracture of sternum, initial encounter for closed fracture: Secondary | ICD-10-CM | POA: Diagnosis not present

## 2017-03-09 DIAGNOSIS — T148XXA Other injury of unspecified body region, initial encounter: Secondary | ICD-10-CM | POA: Diagnosis not present

## 2017-03-09 DIAGNOSIS — Y999 Unspecified external cause status: Secondary | ICD-10-CM | POA: Diagnosis not present

## 2017-03-09 DIAGNOSIS — R519 Headache, unspecified: Secondary | ICD-10-CM

## 2017-03-09 DIAGNOSIS — R51 Headache: Secondary | ICD-10-CM | POA: Insufficient documentation

## 2017-03-09 DIAGNOSIS — S3991XA Unspecified injury of abdomen, initial encounter: Secondary | ICD-10-CM | POA: Diagnosis not present

## 2017-03-09 DIAGNOSIS — S299XXA Unspecified injury of thorax, initial encounter: Secondary | ICD-10-CM | POA: Diagnosis not present

## 2017-03-09 LAB — URINALYSIS, ROUTINE W REFLEX MICROSCOPIC
Bilirubin Urine: NEGATIVE
Glucose, UA: NEGATIVE mg/dL
Ketones, ur: 20 mg/dL — AB
Nitrite: NEGATIVE
Protein, ur: NEGATIVE mg/dL
Specific Gravity, Urine: >1.046 — ABNORMAL HIGH (ref 1.005–1.030)
pH: 5 (ref 5.0–8.0)

## 2017-03-09 LAB — COMPREHENSIVE METABOLIC PANEL
ALT: 16 U/L (ref 14–54)
AST: 24 U/L (ref 15–41)
Albumin: 4 g/dL (ref 3.5–5.0)
Alkaline Phosphatase: 60 U/L (ref 38–126)
Anion gap: 8 (ref 5–15)
BUN: 8 mg/dL (ref 6–20)
CO2: 22 mmol/L (ref 22–32)
Calcium: 8.8 mg/dL — ABNORMAL LOW (ref 8.9–10.3)
Chloride: 111 mmol/L (ref 101–111)
Creatinine, Ser: 0.72 mg/dL (ref 0.44–1.00)
GFR calc Af Amer: >60 mL/min (ref >60)
GFR calc non Af Amer: >60 mL/min (ref >60)
Glucose, Bld: 98 mg/dL (ref 65–99)
Potassium: 4 mmol/L (ref 3.5–5.1)
Sodium: 141 mmol/L (ref 135–145)
Total Bilirubin: 0.6 mg/dL (ref 0.3–1.2)
Total Protein: 6.2 g/dL — ABNORMAL LOW (ref 6.5–8.1)

## 2017-03-09 LAB — CBC
HCT: 41.7 % (ref 36.0–46.0)
Hemoglobin: 13.9 g/dL (ref 12.0–15.0)
MCH: 31 pg (ref 26.0–34.0)
MCHC: 33.3 g/dL (ref 30.0–36.0)
MCV: 93.1 fL (ref 78.0–100.0)
Platelets: 196 10*3/uL (ref 150–400)
RBC: 4.48 MIL/uL (ref 3.87–5.11)
RDW: 13.2 % (ref 11.5–15.5)
WBC: 8.7 10*3/uL (ref 4.0–10.5)

## 2017-03-09 LAB — I-STAT BETA HCG BLOOD, ED (MC, WL, AP ONLY): I-stat hCG, quantitative: <5 m[IU]/mL (ref <5)

## 2017-03-09 MED ORDER — CYCLOBENZAPRINE HCL 10 MG PO TABS: 10.0000 mg | ORAL_TABLET | Freq: Two times a day (BID) | ORAL | 0 refills | Status: DC | PRN

## 2017-03-09 MED ORDER — ACETAMINOPHEN 500 MG PO TABS
1000.0000 mg | ORAL_TABLET | Freq: Once | ORAL | Status: AC
  Administered 2017-03-09: 1000 mg via ORAL
  Filled 2017-03-09: qty 2

## 2017-03-09 MED ORDER — ONDANSETRON HCL 4 MG/2ML IJ SOLN
4.0000 mg | Freq: Once | INTRAMUSCULAR | Status: AC
  Administered 2017-03-09: 4 mg via INTRAVENOUS
  Filled 2017-03-09: qty 2

## 2017-03-09 MED ORDER — TETANUS-DIPHTH-ACELL PERTUSSIS 5-2.5-18.5 LF-MCG/0.5 IM SUSP
0.5000 mL | Freq: Once | INTRAMUSCULAR | Status: AC
Start: 2017-03-09 — End: 2017-03-09
  Administered 2017-03-09: 0.5 mL via INTRAMUSCULAR
  Filled 2017-03-09: qty 0.5

## 2017-03-09 MED ORDER — IOPAMIDOL (ISOVUE-300) INJECTION 61%
INTRAVENOUS | Status: AC
Start: 2017-03-09 — End: 2017-03-09
  Administered 2017-03-09: 100 mL
  Filled 2017-03-09: qty 100

## 2017-03-09 MED ORDER — TRAMADOL HCL 50 MG PO TABS: 50.0000 mg | ORAL_TABLET | Freq: Two times a day (BID) | ORAL | 0 refills | Status: DC | PRN
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
## 2017-03-09 NOTE — Telephone Encounter (Signed)
ED notes and imaging reviewed w/ Dr. Clent RidgesFry. He has concern for possible latent injury or damage that did not show on initial ED eval. Per ED notes, does not appear that pelvic exam was completed. Recommended patient return to ED for re-evaluation. Pt notified of results/instructions and verbalized understanding.
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
## 2017-03-09 NOTE — ED Notes (Signed)
Patient transported to CT 

## 2017-03-09 NOTE — Discharge Instructions (Addendum)
Use the incentive spirometer at least 2-3 times daily to avoid developing a pneumonia.  Alternate 600 mg of ibuprofen and [phone] mg of Tylenol every 3 hours as needed for pain. Do not exceed 4000 mg of Tylenol daily. You may take tramadol as needed for severe pain but do not drive, drink alcohol, or operate heavy machinery on this medication.  You may take Flexeril up to twice daily as needed for muscle spasms. This medication may make you drowsy, so I typically only recommended at night. If this medication makes you drowsy throughout the day, no driving, drinking alcohol, or operating heavy machinery. You may also cut these tablets in half.  Ice to areas of soreness for the next few days and then may move to heat. Do some gentle stretching throughout the day, especially during hot showers or baths. Take short frequent walks and avoid prolonged periods of sitting or laying. Expect to be sore for the next few day and follow up with primary care physician for recheck of ongoing symptoms but return to ER for emergent changing or worsening of symptoms such as severe headache that gets worse, altered mental status/behaving unusually, persistent vomiting, excessive drowsiness, numbness to the arms or legs, unsteady gait, or slurred speech.
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
## 2017-03-13 ENCOUNTER — Ambulatory Visit: Payer: BLUE CROSS/BLUE SHIELD | Admitting: Family Medicine

## 2017-03-13 ENCOUNTER — Encounter: Payer: Self-pay | Admitting: Family Medicine

## 2017-03-13 VITALS — BP 130/80 | HR 92 | Temp 98.7°F | Wt 150.0 lb

## 2017-03-13 DIAGNOSIS — S2220XD Unspecified fracture of sternum, subsequent encounter for fracture with routine healing: Secondary | ICD-10-CM | POA: Diagnosis not present
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
## 2017-03-15 ENCOUNTER — Telehealth: Payer: Self-pay | Admitting: Family Medicine
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
## 2017-03-16 NOTE — Telephone Encounter (Signed)
Called and left a VM for the pt advised her that I am unable to e-mail her work note and I have never tried to send a work note through [employer]. I advised her that I could do a written copy for her to come by any pick up of if she knows her works fax number I could fax it for her. Advised pt to call me back.

## 2017-03-16 NOTE — Telephone Encounter (Signed)
Pt returned call. She will come by to pick up today before the office is closed if possible.

## 2017-03-18 ENCOUNTER — Encounter: Payer: Self-pay | Admitting: Family Medicine

## 2017-03-18 DIAGNOSIS — G44319 Acute post-traumatic headache, not intractable: Secondary | ICD-10-CM

## 2017-03-19 NOTE — Telephone Encounter (Signed)
Note created placed for PCP to sign.

## 2017-03-19 NOTE — Telephone Encounter (Signed)
Note signed. Called pt left VM to advised the work note is ready for pick up. Work note placed up front.

## 2017-03-21 ENCOUNTER — Encounter (INDEPENDENT_AMBULATORY_CARE_PROVIDER_SITE_OTHER): Payer: Self-pay | Admitting: Physician Assistant

## 2017-03-21 ENCOUNTER — Ambulatory Visit (INDEPENDENT_AMBULATORY_CARE_PROVIDER_SITE_OTHER): Payer: BLUE CROSS/BLUE SHIELD | Admitting: Physician Assistant

## 2017-03-21 DIAGNOSIS — S20211A Contusion of right front wall of thorax, initial encounter: Secondary | ICD-10-CM

## 2017-03-21 DIAGNOSIS — S060X0A Concussion without loss of consciousness, initial encounter: Secondary | ICD-10-CM
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
## 2017-03-22 NOTE — Telephone Encounter (Signed)
I ordered a CT scan of the head

## 2017-03-26 ENCOUNTER — Telehealth (INDEPENDENT_AMBULATORY_CARE_PROVIDER_SITE_OTHER): Payer: Self-pay | Admitting: Orthopaedic Surgery

## 2017-03-26 NOTE — Telephone Encounter (Signed)
Can we try to get her in much sooner, like a STAT appointment since Bronson CurbGil is worried about concussion

## 2017-03-26 NOTE — Telephone Encounter (Signed)
Patient called stating that she was able to get in to see the neurologist but not until May 22nd, she says that would be pointless to go to since it's so far out. She was wondering if she could get in sooner somewhere else, or what her other options are. CB # [phone]

## 2017-03-27 ENCOUNTER — Telehealth: Payer: Self-pay

## 2017-03-27 ENCOUNTER — Inpatient Hospital Stay: Admission: RE | Admit: 2017-03-27 | Payer: BLUE CROSS/BLUE SHIELD | Source: Ambulatory Visit
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
## 2017-03-27 NOTE — Telephone Encounter (Signed)
Received call from [facility] which stated they believe pt would be seen sooner if referred to the concussion clinic with Dr. Terrilee FilesZach Smith. [phone]

## 2017-03-27 NOTE — Telephone Encounter (Signed)
I have sent a fax as urgent to [facility] to see if can see pt sooner than May. Pending call back or appt

## 2017-03-28 ENCOUNTER — Ambulatory Visit: Payer: BLUE CROSS/BLUE SHIELD | Admitting: Family Medicine

## 2017-03-28 ENCOUNTER — Encounter: Payer: Self-pay | Admitting: Family Medicine

## 2017-03-28 DIAGNOSIS — S060XAA Concussion with loss of consciousness status unknown, initial encounter: Secondary | ICD-10-CM | POA: Insufficient documentation

## 2017-03-28 DIAGNOSIS — S060X9A Concussion with loss of consciousness of unspecified duration, initial encounter: Secondary | ICD-10-CM | POA: Insufficient documentation

## 2017-03-28 DIAGNOSIS — S060X0A Concussion without loss of consciousness, initial encounter: Secondary | ICD-10-CM | POA: Diagnosis not present

## 2017-03-28 NOTE — Assessment & Plan Note (Signed)
Believe the patient's concussion is very mild.  Has Artie been back to work for the last 2 weeks.  Discussed with patient about icing regimen, home exercise, which activities of doing which wants to avoid.  Patient is to increase activity slowly over the course of next several days.  We discussed over-the-counter medications.  Notably the patient will do well.  Follow-up again though in 2 weeks.  Worsening symptoms repeat testing.
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
## 2017-03-28 NOTE — Telephone Encounter (Signed)
Thank you :)

## 2017-03-28 NOTE — Telephone Encounter (Signed)
Pt has appt this afternoon with Terrilee FilesZach Smith at concussion clinic.

## 2017-03-28 NOTE — Telephone Encounter (Signed)
I called over to [facility] and they will contact pt to schedule appt

## 2017-03-28 NOTE — Patient Instructions (Signed)
Good to see you  I think concussion is mostly gone but still fatigued Adjustable standing desk.  Fish oil 3 gramss daily  Turmeric 500mg  daily  CoQ10 200mg  daily  Iron 65mg  with 500mg  of vitamin C daily for 2 weeks then go to around menstration thereafter See me again in 2 weeks
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
## 2017-04-16 ENCOUNTER — Ambulatory Visit (INDEPENDENT_AMBULATORY_CARE_PROVIDER_SITE_OTHER): Payer: BLUE CROSS/BLUE SHIELD | Admitting: Family Medicine

## 2017-04-16 ENCOUNTER — Encounter: Payer: Self-pay | Admitting: Family Medicine

## 2017-04-16 DIAGNOSIS — S060X0D Concussion without loss of consciousness, subsequent encounter: Secondary | ICD-10-CM | POA: Diagnosis not present

## 2017-04-16 NOTE — Assessment & Plan Note (Addendum)
Patient is nearly symptom-free at this time.  Not taking any prescription medications.  Has only had 2 headaches since last visit 2 weeks ago.  Doing significantly better at this time.  As long as patient is welcome follow-up as needed.

## 2017-05-23 ENCOUNTER — Ambulatory Visit: Payer: BLUE CROSS/BLUE SHIELD | Admitting: Neurology

## 2017-05-23 ENCOUNTER — Encounter

## 2017-09-04 ENCOUNTER — Other Ambulatory Visit: Payer: Self-pay | Admitting: Occupational Medicine

## 2017-09-04 ENCOUNTER — Ambulatory Visit: Payer: Self-pay

## 2017-09-04 DIAGNOSIS — Z0000 Encounter for general adult medical examination without abnormal findings: Secondary | ICD-10-CM

## 2017-10-03 DIAGNOSIS — J321 Chronic frontal sinusitis: Secondary | ICD-10-CM | POA: Diagnosis not present

## 2017-11-08 DIAGNOSIS — Z0000 Encounter for general adult medical examination without abnormal findings: Secondary | ICD-10-CM | POA: Diagnosis not present

## 2017-11-20 DIAGNOSIS — Z01419 Encounter for gynecological examination (general) (routine) without abnormal findings: Secondary | ICD-10-CM | POA: Diagnosis not present

## 2017-11-20 DIAGNOSIS — Z124 Encounter for screening for malignant neoplasm of cervix: Secondary | ICD-10-CM | POA: Diagnosis not present

## 2017-11-20 DIAGNOSIS — Z6827 Body mass index (BMI) 27.0-27.9, adult: Secondary | ICD-10-CM | POA: Diagnosis not present

## 2018-05-23 DIAGNOSIS — Z3162 Encounter for fertility preservation counseling: Secondary | ICD-10-CM | POA: Diagnosis not present

## 2018-11-07 DIAGNOSIS — Z20828 Contact with and (suspected) exposure to other viral communicable diseases: Secondary | ICD-10-CM | POA: Diagnosis not present

## 2018-12-19 DIAGNOSIS — Z6826 Body mass index (BMI) 26.0-26.9, adult: Secondary | ICD-10-CM | POA: Diagnosis not present

## 2018-12-19 DIAGNOSIS — Z01419 Encounter for gynecological examination (general) (routine) without abnormal findings: Secondary | ICD-10-CM | POA: Diagnosis not present

## 2019-06-11 DIAGNOSIS — Z319 Encounter for procreative management, unspecified: Secondary | ICD-10-CM | POA: Diagnosis not present

## 2019-06-11 DIAGNOSIS — N978 Female infertility of other origin: Secondary | ICD-10-CM | POA: Diagnosis not present

## 2019-07-08 ENCOUNTER — Ambulatory Visit: Payer: BC Managed Care – PPO | Admitting: Family Medicine

## 2019-07-08 ENCOUNTER — Encounter: Payer: Self-pay | Admitting: Family Medicine

## 2019-07-08 ENCOUNTER — Other Ambulatory Visit: Payer: Self-pay

## 2019-07-08 VITALS — BP 110/72 | HR 74 | Temp 98.4°F | Ht 62.0 in | Wt 147.0 lb

## 2019-07-08 DIAGNOSIS — R7989 Other specified abnormal findings of blood chemistry: Secondary | ICD-10-CM | POA: Diagnosis not present

## 2019-07-08 LAB — T3, FREE: T3, Free: 2.8 pg/mL (ref 2.3–4.2)

## 2019-07-08 LAB — TSH: TSH: 0.25 u[IU]/mL — ABNORMAL LOW (ref 0.35–4.50)

## 2019-07-08 LAB — T4, FREE: Free T4: 1.06 ng/dL (ref 0.60–1.60)

## 2019-07-08 NOTE — Progress Notes (Signed)
   Subjective:    Patient ID: Jill Willis, female    DOB: 02/07/1985, 34 y.o.   MRN: [id number]  HPI Here to discuss an abnormal TSH result. She and her husband have been trying unsuccessfully to get pregnant for over 2 years, so her GYN, Dr. Claiborne Billings, referred them to the [facility] clinic. At her first visit on June 9 she had basic labs drawn, including a TSH. All these were normal except the TSH was slightly low at 0.272. They then asked her to follow up with her PCP. She feels fine in general. Her weight has been stable.   Review of Systems  Constitutional: Negative.   Respiratory: Negative.   Cardiovascular: Negative.   Endocrine: Negative.   Neurological: Negative.        Objective:   Physical Exam Constitutional:      Appearance: Normal appearance.  Cardiovascular:     Rate and Rhythm: Normal rate and regular rhythm.     Pulses: Normal pulses.     Heart sounds: Normal heart sounds.  Pulmonary:     Effort: Pulmonary effort is normal.     Breath sounds: Normal breath sounds.  Neurological:     Mental Status: She is alert.           Assessment & Plan:  She recently had an abnormal TSH result, so we will send her for a complete thyroid panel today. Follow up accordingly.  Gershon Crane, MD

## 2019-08-13 DIAGNOSIS — Z789 Other specified health status: Secondary | ICD-10-CM | POA: Diagnosis not present

## 2019-08-13 DIAGNOSIS — R946 Abnormal results of thyroid function studies: Secondary | ICD-10-CM | POA: Diagnosis not present

## 2019-08-13 DIAGNOSIS — N943 Premenstrual tension syndrome: Secondary | ICD-10-CM | POA: Diagnosis not present

## 2019-08-13 DIAGNOSIS — R5383 Other fatigue: Secondary | ICD-10-CM | POA: Diagnosis not present

## 2019-08-13 DIAGNOSIS — N979 Female infertility, unspecified: Secondary | ICD-10-CM | POA: Diagnosis not present

## 2019-08-15 DIAGNOSIS — R946 Abnormal results of thyroid function studies: Secondary | ICD-10-CM | POA: Diagnosis not present

## 2019-08-15 DIAGNOSIS — E559 Vitamin D deficiency, unspecified: Secondary | ICD-10-CM | POA: Diagnosis not present

## 2019-08-15 DIAGNOSIS — R5383 Other fatigue: Secondary | ICD-10-CM | POA: Diagnosis not present

## 2019-08-15 DIAGNOSIS — G4700 Insomnia, unspecified: Secondary | ICD-10-CM | POA: Diagnosis not present

## 2019-08-15 DIAGNOSIS — N943 Premenstrual tension syndrome: Secondary | ICD-10-CM | POA: Diagnosis not present

## 2019-09-12 DIAGNOSIS — R5383 Other fatigue: Secondary | ICD-10-CM | POA: Diagnosis not present

## 2019-09-12 DIAGNOSIS — R946 Abnormal results of thyroid function studies: Secondary | ICD-10-CM | POA: Diagnosis not present

## 2019-09-12 DIAGNOSIS — N943 Premenstrual tension syndrome: Secondary | ICD-10-CM | POA: Diagnosis not present

## 2019-09-12 DIAGNOSIS — G4700 Insomnia, unspecified: Secondary | ICD-10-CM | POA: Diagnosis not present

## 2019-11-26 DIAGNOSIS — R7982 Elevated C-reactive protein (CRP): Secondary | ICD-10-CM | POA: Diagnosis not present

## 2019-11-26 DIAGNOSIS — E559 Vitamin D deficiency, unspecified: Secondary | ICD-10-CM | POA: Diagnosis not present

## 2019-11-26 DIAGNOSIS — R946 Abnormal results of thyroid function studies: Secondary | ICD-10-CM | POA: Diagnosis not present

## 2019-11-26 DIAGNOSIS — R5383 Other fatigue: Secondary | ICD-10-CM | POA: Diagnosis not present

## 2019-11-26 DIAGNOSIS — N943 Premenstrual tension syndrome: Secondary | ICD-10-CM | POA: Diagnosis not present

## 2019-12-12 DIAGNOSIS — R946 Abnormal results of thyroid function studies: Secondary | ICD-10-CM | POA: Diagnosis not present

## 2019-12-12 DIAGNOSIS — R5383 Other fatigue: Secondary | ICD-10-CM | POA: Diagnosis not present

## 2019-12-12 DIAGNOSIS — G4700 Insomnia, unspecified: Secondary | ICD-10-CM | POA: Diagnosis not present

## 2019-12-12 DIAGNOSIS — R7982 Elevated C-reactive protein (CRP): Secondary | ICD-10-CM | POA: Diagnosis not present

## 2019-12-17 ENCOUNTER — Encounter: Payer: Self-pay | Admitting: Family Medicine

## 2019-12-17 DIAGNOSIS — R7989 Other specified abnormal findings of blood chemistry: Secondary | ICD-10-CM

## 2019-12-23 DIAGNOSIS — Z6827 Body mass index (BMI) 27.0-27.9, adult: Secondary | ICD-10-CM | POA: Diagnosis not present

## 2019-12-23 DIAGNOSIS — Z01419 Encounter for gynecological examination (general) (routine) without abnormal findings: Secondary | ICD-10-CM | POA: Diagnosis not present

## 2019-12-23 NOTE — Telephone Encounter (Signed)
I did the referral to see Dr. Sharl Ma

## 2020-01-03 NOTE — L&D Delivery Note (Addendum)
Operative Delivery Note At 2:28 PM a viable female was delivered via Vaginal, Spontaneous.  Presentation: vertex; Position: Right,, Occiput,, Anterior; Station: +3.  Verbal consent: obtained from patient.  Risks and benefits discussed in detail.  Risks include, but are not limited to the risks of anesthesia, bleeding, infection, damage to maternal tissues, fetal cephalhematoma.  There is also the risk of inability to effect vaginal delivery of the head, or shoulder dystocia that cannot be resolved by established maneuvers, leading to the need for emergency cesarean section.  APGAR: 7, 9;   Placenta status: intact, to pathology Cord:  3VC with the following complications:nuchal x1 Cord pH: 7.32 V  UOP 350cc via red rubber prior to repair  Anesthesia:  Epidural Instruments: Mity Vac Episiotomy: midline Lacerations: 2nd degree, left vaginal wall Suture Repair: 2.0 3.0 vicryl Est. Blood Loss (mL):  600cc  Mom to postpartum.  Baby to Couplet care / Skin to Skin.  Delivery at 1428, continue Amp/Gent 24hrs. 1g TXA given for initial gush of blood after placenta delivery  Jill Willis Jill Willis 12/19/2020, 3:00 PM

## 2020-03-12 ENCOUNTER — Other Ambulatory Visit: Payer: Self-pay

## 2020-03-12 ENCOUNTER — Encounter (HOSPITAL_COMMUNITY): Payer: Self-pay | Admitting: Emergency Medicine

## 2020-03-12 ENCOUNTER — Emergency Department (HOSPITAL_COMMUNITY): Payer: BC Managed Care – PPO

## 2020-03-12 ENCOUNTER — Observation Stay (HOSPITAL_COMMUNITY)
Admission: EM | Admit: 2020-03-12 | Discharge: 2020-03-13 | Disposition: A | Discharge status: Home / Self Care | Payer: BC Managed Care – PPO | Attending: Obstetrics | Admitting: Obstetrics

## 2020-03-12 DIAGNOSIS — N9489 Other specified conditions associated with female genital organs and menstrual cycle: Secondary | ICD-10-CM | POA: Diagnosis present

## 2020-03-12 DIAGNOSIS — N988 Other complications associated with artificial fertilization: Secondary | ICD-10-CM | POA: Diagnosis not present

## 2020-03-12 DIAGNOSIS — N938 Other specified abnormal uterine and vaginal bleeding: Secondary | ICD-10-CM | POA: Insufficient documentation

## 2020-03-12 DIAGNOSIS — Z20822 Contact with and (suspected) exposure to covid-19: Secondary | ICD-10-CM | POA: Insufficient documentation

## 2020-03-12 DIAGNOSIS — R1084 Generalized abdominal pain: Secondary | ICD-10-CM | POA: Diagnosis present

## 2020-03-12 LAB — RESP PANEL BY RT-PCR (FLU A&B, COVID) ARPGX2
Influenza A by PCR: NEGATIVE
Influenza B by PCR: NEGATIVE
SARS Coronavirus 2 by RT PCR: NEGATIVE

## 2020-03-12 LAB — LIPASE, BLOOD: Lipase: 30 U/L (ref 11–51)

## 2020-03-12 LAB — CBC
HCT: 33.4 % — ABNORMAL LOW (ref 36.0–46.0)
Hemoglobin: 10.9 g/dL — ABNORMAL LOW (ref 12.0–15.0)
MCH: 31.3 pg (ref 26.0–34.0)
MCHC: 32.6 g/dL (ref 30.0–36.0)
MCV: 96 fL (ref 80.0–100.0)
Platelets: 272 10*3/uL (ref 150–400)
RBC: 3.48 MIL/uL — ABNORMAL LOW (ref 3.87–5.11)
RDW: 12.9 % (ref 11.5–15.5)
WBC: 15.4 10*3/uL — ABNORMAL HIGH (ref 4.0–10.5)
nRBC: 0 % (ref 0.0–0.2)

## 2020-03-12 LAB — COMPREHENSIVE METABOLIC PANEL
ALT: 15 U/L (ref 0–44)
AST: 20 U/L (ref 15–41)
Albumin: 3.5 g/dL (ref 3.5–5.0)
Alkaline Phosphatase: 47 U/L (ref 38–126)
Anion gap: 4 — ABNORMAL LOW (ref 5–15)
BUN: 7 mg/dL (ref 6–20)
CO2: 25 mmol/L (ref 22–32)
Calcium: 8.7 mg/dL — ABNORMAL LOW (ref 8.9–10.3)
Chloride: 105 mmol/L (ref 98–111)
Creatinine, Ser: 0.74 mg/dL (ref 0.44–1.00)
GFR, Estimated: >60 mL/min (ref >60)
Glucose, Bld: 190 mg/dL — ABNORMAL HIGH (ref 70–99)
Potassium: 4.8 mmol/L (ref 3.5–5.1)
Sodium: 134 mmol/L — ABNORMAL LOW (ref 135–145)
Total Bilirubin: 0.5 mg/dL (ref 0.3–1.2)
Total Protein: 5.8 g/dL — ABNORMAL LOW (ref 6.5–8.1)

## 2020-03-12 LAB — URINALYSIS, ROUTINE W REFLEX MICROSCOPIC
Bacteria, UA: NONE SEEN
Bilirubin Urine: NEGATIVE
Glucose, UA: NEGATIVE mg/dL
Ketones, ur: NEGATIVE mg/dL
Leukocytes,Ua: NEGATIVE
Nitrite: NEGATIVE
Protein, ur: NEGATIVE mg/dL
Specific Gravity, Urine: 1.032 — ABNORMAL HIGH (ref 1.005–1.030)
pH: 6 (ref 5.0–8.0)

## 2020-03-12 MED ORDER — ONDANSETRON HCL 4 MG PO TABS: 4.0000 mg | ORAL_TABLET | Freq: Four times a day (QID) | ORAL | Status: DC | PRN

## 2020-03-12 MED ORDER — PRENATAL MULTIVITAMIN CH
1.0000 | ORAL_TABLET | Freq: Every day | ORAL | Status: DC
  Administered 2020-03-13: 1 via ORAL
  Filled 2020-03-12: qty 1

## 2020-03-12 MED ORDER — ONDANSETRON HCL 4 MG/2ML IJ SOLN: 4.0000 mg | Freq: Four times a day (QID) | INTRAMUSCULAR | Status: DC | PRN

## 2020-03-12 MED ORDER — HYDROMORPHONE HCL 1 MG/ML IJ SOLN
0.2000 mg | INTRAMUSCULAR | Status: DC | PRN
  Administered 2020-03-12 – 2020-03-13 (×4): 0.6 mg via INTRAVENOUS
  Filled 2020-03-12 (×4): qty 1

## 2020-03-12 MED ORDER — SODIUM CHLORIDE 0.9 % IV BOLUS
1000.0000 mL | Freq: Once | INTRAVENOUS | Status: AC
  Administered 2020-03-12: 1000 mL via INTRAVENOUS

## 2020-03-12 MED ORDER — IOHEXOL 300 MG/ML  SOLN
100.0000 mL | Freq: Once | INTRAMUSCULAR | Status: AC | PRN
  Administered 2020-03-12: 100 mL via INTRAVENOUS

## 2020-03-12 MED ORDER — HYDROMORPHONE HCL 1 MG/ML IJ SOLN
0.5000 mg | Freq: Once | INTRAMUSCULAR | Status: AC
  Administered 2020-03-12: 0.5 mg via INTRAVENOUS
  Filled 2020-03-12: qty 1

## 2020-03-12 MED ORDER — LACTATED RINGERS IV SOLN: INTRAVENOUS | Status: DC

## 2020-03-12 NOTE — ED Provider Notes (Signed)
[facility] EMERGENCY DEPARTMENT Provider Note   CSN: [id number] Arrival date & time: 03/12/20  9518     History Chief Complaint  Patient presents with  . Abdominal Pain    Jill Willis is a 35 y.o. female.  HPI Patient presents with abdominal pain.  Had a retrieval today done at around 7:00.  Around noon today.  She began to have more pain.  States that it gets crampy at times.  Pain does get severe.  States she cannot lay back to the pain.  Pain is in the upper abdomen although does have some in the lower abdomen.  Slight amount of vaginal bleeding.  No nausea or vomiting.  States she cannot lay back because the pain is so severe.  States she called [facility] and was told to come to the ER since they could not get a hold of the provider.    Past Medical History:  Diagnosis Date  . Chickenpox   . Sinusitis   . Sore throat     Patient Active Problem List   Diagnosis Date Noted  . Pelvic hematoma, female 03/12/2020  . Mild concussion 03/28/2017  . Pain of right great toe 09/13/2016  . CHICKENPOX, HX OF 02/25/2007    Past Surgical History:  Procedure Laterality Date  . TONSILLECTOMY       OB History   No obstetric history on file.     Family History  Problem Relation Age of Onset  . Hypothyroidism Mother   . Diabetes Paternal Grandfather     Social History   Tobacco Use  . Smoking status: Never Smoker  . Smokeless tobacco: Never Used  Substance Use Topics  . Alcohol use: Yes    Alcohol/week: 0.0 standard drinks    Comment: very little  . Drug use: No    Home Medications Prior to Admission medications   Not on File    Allergies    Patient has no known allergies.  Review of Systems   Review of Systems  Constitutional: Negative for appetite change.  HENT: Negative for congestion.   Gastrointestinal: Positive for abdominal pain.  Genitourinary: Positive for vaginal bleeding.  Musculoskeletal: Negative for back  pain.  Skin: Negative for rash.  Neurological: Negative for weakness.  Psychiatric/Behavioral: Negative for confusion.    Physical Exam Updated Vital Signs BP 119/76 (BP Location: Right Arm)   Pulse 95   Temp 99 F (37.2 C) (Oral)   Resp 18   Ht 5\' 2"  (1.575 m)   Wt 66.7 kg   SpO2 100%   BMI 26.90 kg/m   Physical Exam Vitals and nursing note reviewed.  HENT:     Head: Atraumatic.  Cardiovascular:     Rate and Rhythm: Tachycardia present.  Pulmonary:     Breath sounds: Normal breath sounds.  Abdominal:     Hernia: No hernia is present.     Comments: Severe tenderness in upper abdomen.  Required patient to stand up after palpation.  Skin:    General: Skin is warm.     Capillary Refill: Capillary refill takes less than 2 seconds.  Neurological:     Mental Status: She is alert and oriented to person, place, and time.     ED Results / Procedures / Treatments   Labs (all labs ordered are listed, but only abnormal results are displayed) Labs Reviewed  COMPREHENSIVE METABOLIC PANEL - Abnormal; Notable for the following components:      Result Value  Sodium 134 (*)    Glucose, Bld 190 (*)    Calcium 8.7 (*)    Total Protein 5.8 (*)    Anion gap 4 (*)    All other components within normal limits  CBC - Abnormal; Notable for the following components:   WBC 15.4 (*)    RBC 3.48 (*)    Hemoglobin 10.9 (*)    HCT 33.4 (*)    All other components within normal limits  URINALYSIS, ROUTINE W REFLEX MICROSCOPIC - Abnormal; Notable for the following components:   Color, Urine STRAW (*)    Specific Gravity, Urine 1.032 (*)    Hgb urine dipstick MODERATE (*)    All other components within normal limits  RESP PANEL BY RT-PCR (FLU A&B, COVID) ARPGX2  LIPASE, BLOOD  CBC    EKG None  Radiology CT ABDOMEN PELVIS W CONTRAST  Result Date: 03/12/2020 CLINICAL DATA:  Abdominal pain and vaginal bleeding, status post ovarian stimulation and egg retrieval procedure today.  EXAM: CT ABDOMEN AND PELVIS WITH CONTRAST TECHNIQUE: Multidetector CT imaging of the abdomen and pelvis was performed using the standard protocol following bolus administration of intravenous contrast. CONTRAST:  OMNIPAQUE IOHEXOL 300 MG/ML  SOLN COMPARISON:  March 09, 2017 FINDINGS: Lower chest: No acute abnormality. Hepatobiliary: A 5 mm focus of parenchymal low attenuation is seen at the junction of the right and left lobes of the liver. An additional 6 mm focus of parenchymal low attenuation is seen within the posterior aspect of the right lobe. No gallstones, gallbladder wall thickening, or biliary dilatation. Pancreas: Unremarkable. No pancreatic ductal dilatation or surrounding inflammatory changes. Spleen: Normal in size without focal abnormality. Adrenals/Urinary Tract: Adrenal glands are unremarkable. Kidneys are normal, without renal calculi, focal lesion, or hydronephrosis. Bladder is unremarkable. Stomach/Bowel: Stomach is within normal limits. Appendix appears normal. No evidence of bowel wall thickening, distention, or inflammatory changes. Vascular/Lymphatic: No significant vascular findings are present. No enlarged abdominal or pelvic lymph nodes. Reproductive: The uterus is normal in size in appearance. A 6.1 cm x 5.1 cm x 6.4 cm area of heterogeneous attenuation is seen within the left adnexa (axial CT images 58 through 71, CT series number 3). A similar appearing poorly defined area of heterogeneous attenuation is noted within the right adnexa. These areas are surrounded by hemorrhagic free fluid. Tortuous, enhancing vessels are seen along the anterior aspect of the bilateral adnexa. No areas of adnexal contrast extravasation are identified. Other: No abdominal wall hernia or abnormality. A moderate amount of nonhemorrhagic free fluid is seen within the mid and upper abdomen (approximately 41.28 Hounsfield units). A mild to moderate amount of partially hemorrhagic free fluid is seen within  the pelvis (approximately 74.41 Hounsfield units). Musculoskeletal: No acute or significant osseous findings. IMPRESSION: 1. Findings consistent with bilateral adnexal hematomas, left larger than right, with surrounding partially hemorrhagic pelvic free fluid. This is likely secondary to the patient's recent ovarian stimulation and a retrieval procedure. 2. Moderate amount of nonhemorrhagic free fluid within the upper abdomen. Electronically Signed   By: Aram Candela M.D.   On: 03/12/2020 19:16    Procedures Procedures   Medications Ordered in ED Medications  prenatal multivitamin tablet 1 tablet (has no administration in time range)  lactated ringers infusion ( Intravenous New Bag/Given 03/12/20 2154)  ondansetron (ZOFRAN) tablet 4 mg (has no administration in time range)    Or  ondansetron (ZOFRAN) injection 4 mg (has no administration in time range)  HYDROmorphone (DILAUDID) injection 0.2-0.6 mg (  0.6 mg Intravenous Given 03/12/20 2135)  HYDROmorphone (DILAUDID) injection 0.5 mg (0.5 mg Intravenous Given 03/12/20 1806)  sodium chloride 0.9 % bolus 1,000 mL (0 mLs Intravenous Stopped 03/12/20 2124)  iohexol (OMNIPAQUE) 300 MG/ML solution 100 mL (100 mLs Intravenous Contrast Given 03/12/20 1838)    ED Course  I have reviewed the triage vital signs and the nursing notes.  Pertinent labs & imaging results that were available during my care of the patient were reviewed by me and considered in my medical decision making (see chart for details).    MDM Rules/Calculators/A&P                          Patient with abdominal pain after egg retrieval today.  Procedure done close to 7.pain started at around noon.  Pain is mostly in upper abdomen.  However CT scan done and showed fluid from upper abdomen.  This does not appear to be blood but there are bilateral adnexal hematomas.  Mild decrease in hemoglobin from prior.  Mild tachycardia initially improved with pain medicines.  Discussed with Dr.  Katrinka Blazing.  Will admit to hospital for further monitoring.  They will consult reproductive endocrinology tomorrow. Final Clinical Impression(s) / ED Diagnoses Final diagnoses:  Pelvic hematoma, female    Rx / DC Orders ED Discharge Orders    None       Benjiman Core, MD 03/12/20 2337

## 2020-03-12 NOTE — H&P (Signed)
35 y.o. G0 presents to ED with sudden onset abdominal pain.  She is undergoing fertility treatments and had an egg retrieval at 0715 this morning.  She did well immediately after and took one tramadol for mild cramping.  Early afternoon, she developed intermittent upper abdominal pain.  She called [facility], but they were unable to reach her physician and told her to present to the ER.  She reports the pain sometimes radiates to the back.  It is worse with lying down.  She denies lower abdominal / pelvic pain.  Scant bleeding since procedure.  She denies nausea / vomiting.  She received one dose of dilaudid before my exam in the ER and was resting comfortably in bed, though pain returned when bed was reclined for exam.   She underwent a CT scan upon presentation to the ER:   Reproductive: The uterus is normal in size in appearance. A 6.1 cm x 5.1 cm x 6.4 cm area of heterogeneous attenuation is seen within the left adnexa (axial CT images 58 through 71, CT series number 3). A similar appearing poorly defined area of heterogeneous attenuation is noted within the right adnexa. These areas are surrounded by hemorrhagic free fluid. Tortuous, enhancing vessels are seen along the anterior aspect of the bilateral adnexa. No areas of adnexal contrast extravasation are identified.  Other: No abdominal wall hernia or abnormality.  A moderate amount of nonhemorrhagic free fluid is seen within the mid and upper abdomen (approximately 41.28 Hounsfield units).  A mild to moderate amount of partially hemorrhagic free fluid is seen within the pelvis (approximately 74.41 Hounsfield units).  Musculoskeletal: No acute or significant osseous findings.  IMPRESSION: 1. Findings consistent with bilateral adnexal hematomas, left larger than right, with surrounding partially hemorrhagic pelvic free fluid. This is likely secondary to the patient's recent ovarian stimulation and a retrieval  procedure. 2. Moderate amount of nonhemorrhagic free fluid within the upper abdomen.     Past Medical History:  Diagnosis Date  . Chickenpox   . Sinusitis   . Sore throat     Past Surgical History:  Procedure Laterality Date  . TONSILLECTOMY      OB History  No obstetric history on file.    Social History   Socioeconomic History  . Marital status: Married    Spouse name: Not on file  . Number of children: Not on file  . Years of education: Not on file  . Highest education level: Not on file  Occupational History  . Not on file  Tobacco Use  . Smoking status: Never Smoker  . Smokeless tobacco: Never Used  Substance and Sexual Activity  . Alcohol use: Yes    Alcohol/week: 0.0 standard drinks    Comment: very little  . Drug use: No  . Sexual activity: Yes    Birth control/protection: Pill  Other Topics Concern  . Not on file  Social History Narrative  . Not on file   Social Determinants of Health   Financial Resource Strain: Not on file  Food Insecurity: Not on file  Transportation Needs: Not on file  Physical Activity: Not on file  Stress: Not on file  Social Connections: Not on file  Intimate Partner Violence: Not on file   Patient has no known allergies.    Vitals:   03/12/20 2115 03/12/20 2146  BP: 116/62 119/76  Pulse: 78 95  Resp: 17 18  Temp:  99 F (37.2 C)  SpO2: 100% 100%  General:  NAD, resting comfortably during conversation Pulmonary:  Normal RR, no distress Abdomen:  Soft, mild distension in lower and upper abdomen.  No tenderness in lower abdomen, tenderness in upper abdomen.  No rebound tenderness Ex:  no edema     A/P   35 y.o. G0 with abdominal pain following egg retrieval.  Discussed differential diagnosis.  CT reports "bilateral adnexal hematomas"  Would expect some degree of blood / clot in pelvis following egg retrieval, and blood in abdomen could be cause of pain.  Also, with ovarian stimulation, ovaries are  significantly enlarged from baseline.  Also need to consider ovarian torsion vs OHSS.  Her pain is localized to the upper abdomen, she denies nausea / vomiting (and is asking to eat dinner) and her abdomen is soft.  My suspicion for torsion is low at this time.  Also need to consider OHSS given free fluid in upper abdomen, though OHSS typically presents later following the trigger shot.  Her hemoglobin is 10.9 and vital signs are normal. No evidence of active intraperitoneal bleeding, but will  admit for pain control and serial labs. Will discuss with her reproductive endocrinologist in the morning.  Will keep NPO overnight   [name]

## 2020-03-12 NOTE — ED Triage Notes (Addendum)
Patient complains of medial abdominal pain after an egg retrieval procedure this morning at 0715, pain started at approximately 1230. Patient reports vaginal bleeding and variable pain ranging from 5/10 to 10/10. Patient alert, oriented, and in no apparent distress at this time.

## 2020-03-12 NOTE — ED Notes (Signed)
Attempted report x1. 

## 2020-03-12 NOTE — ED Notes (Signed)
Attempted to call report x2

## 2020-03-12 NOTE — ED Notes (Signed)
Attempted report x 3.  

## 2020-03-13 LAB — CBC
HCT: 28 % — ABNORMAL LOW (ref 36.0–46.0)
HCT: 27.6 % — ABNORMAL LOW (ref 36.0–46.0)
Hemoglobin: 9.1 g/dL — ABNORMAL LOW (ref 12.0–15.0)
Hemoglobin: 9 g/dL — ABNORMAL LOW (ref 12.0–15.0)
MCH: 31.1 pg (ref 26.0–34.0)
MCH: 31.3 pg (ref 26.0–34.0)
MCHC: 32.5 g/dL (ref 30.0–36.0)
MCHC: 32.6 g/dL (ref 30.0–36.0)
MCV: 95.6 fL (ref 80.0–100.0)
MCV: 95.8 fL (ref 80.0–100.0)
Platelets: 223 10*3/uL (ref 150–400)
Platelets: 199 10*3/uL (ref 150–400)
RBC: 2.93 MIL/uL — ABNORMAL LOW (ref 3.87–5.11)
RBC: 2.88 MIL/uL — ABNORMAL LOW (ref 3.87–5.11)
RDW: 13.2 % (ref 11.5–15.5)
RDW: 13.2 % (ref 11.5–15.5)
WBC: 16.3 10*3/uL — ABNORMAL HIGH (ref 4.0–10.5)
WBC: 13.7 10*3/uL — ABNORMAL HIGH (ref 4.0–10.5)
nRBC: 0 % (ref 0.0–0.2)
nRBC: 0 % (ref 0.0–0.2)

## 2020-03-13 MED ORDER — HYDROMORPHONE HCL 1 MG/ML IJ SOLN: 0.2000 mg | INTRAMUSCULAR | Status: DC | PRN

## 2020-03-13 MED ORDER — OXYCODONE HCL 5 MG PO TABS
5.0000 mg | ORAL_TABLET | ORAL | Status: DC | PRN
  Administered 2020-03-13: 5 mg via ORAL
  Filled 2020-03-13: qty 1

## 2020-03-13 MED ORDER — OXYCODONE HCL 5 MG PO TABS: 5.0000 mg | ORAL_TABLET | Freq: Four times a day (QID) | ORAL | 0 refills | Status: DC | PRN

## 2020-03-13 MED ORDER — IBUPROFEN 800 MG PO TABS: 800.0000 mg | ORAL_TABLET | Freq: Four times a day (QID) | ORAL | 0 refills | Status: DC | PRN

## 2020-03-13 MED ORDER — IBUPROFEN 800 MG PO TABS
800.0000 mg | ORAL_TABLET | Freq: Four times a day (QID) | ORAL | Status: DC | PRN
  Administered 2020-03-13: 800 mg via ORAL
  Filled 2020-03-13: qty 1

## 2020-03-13 MED ORDER — ACETAMINOPHEN 500 MG PO TABS
1000.0000 mg | ORAL_TABLET | Freq: Four times a day (QID) | ORAL | Status: DC | PRN
  Administered 2020-03-13: 1000 mg via ORAL
  Filled 2020-03-13: qty 2

## 2020-03-13 NOTE — Progress Notes (Signed)
Gave d/c instruction. IV d/c, intact and gauze place. Pt husband has belongings.

## 2020-03-13 NOTE — Progress Notes (Signed)
Patient has tolerated food without nausea / vomiting.   Pain is now at a level 3 after tylenol / ibuprofen / oxycodone.  Desires discharge to home.  Will send rx for cabergoline as well per Dr. April Manson (not on hospital pharmacy formulary).  Patient agrees with plan and will f/u with Dr. April Manson on Monday

## 2020-03-13 NOTE — Discharge Summary (Signed)
Physician Discharge Summary  Patient ID: Jill Willis MRN: [id number] DOB/AGE: 06-14-1985 35 y.o.  Admit date: 03/12/2020 Discharge date: 03/13/2020  Admission Diagnoses: Pelvic pain following egg retrieval  Discharge Diagnoses:  Pelvic pain following egg retrieval  Active Problems:   Pelvic hematoma, female   Discharged Condition: good  Hospital Course: 35 yo G0 presented with acute onset upper abdominal pain.  She had an egg retrieval the morning of presentation and did well immediately post op that did not respond to the prescribed tramadol.  On presentation to the ER she described severe upper abdominal pain.  She underwent a CT scan that showed fluid in upper abdomen, fluid mixed with blood in pelvis and "bilateral adnexal hematomas" that were thought to be merely enlarged ovaries after ovarian stimulation with clot from recent retrieval.  She required IV pain medication in ER.  GIven pain and some questionable blood in pelvis, she was admitted for pain control and IV labs  She was kept NPO overnight.  Serial CBCs showed a stable hemoglobin around 9.  On the morning of hospital day #1, she reported improving pain with IV pain meds.  She was transitioned to a regular diet and PO meds with good pain control.  She ambulated without dizziness, denied nausea or vomiting or lower abdominal pain.  Upper abdominal pain was minimal at time of discharge.  Plan of care was discussed with her reproductive endocrinologist who recommended discharge on cabergoline daily to lower risks of OHSS, with close follow up with him.   Consults: None  Significant Diagnostic Studies: radiology: CT scan: as above  Treatments: IV hydration and analgesia: Dilaudid  Discharge Exam: Blood pressure 110/63, pulse 75, temperature 98.9 F (37.2 C), resp. rate 17, height 5\' 2"  (1.575 m), weight 66.7 kg, SpO2 99 %. General appearance: alert, cooperative and no distress Head: Normocephalic, without obvious  abnormality, atraumatic GI: normal findings: soft, non-tender, mild abdominal distension, but no lower abdominal tenderness and minimal upper abdominal tenderness, no rebound on day of discharge Extremities: extremities normal, atraumatic, no cyanosis or edema  Disposition: Discharge disposition: 01-Home or Self Care       Discharge Instructions    Call MD for:  difficulty breathing, headache or visual disturbances   Complete by: As directed    Call MD for:  extreme fatigue   Complete by: As directed    Call MD for:  hives   Complete by: As directed    Call MD for:  persistant dizziness or light-headedness   Complete by: As directed    Call MD for:  persistant nausea and vomiting   Complete by: As directed    Call MD for:  redness, tenderness, or signs of infection (pain, swelling, redness, odor or green/yellow discharge around incision site)   Complete by: As directed    Call MD for:  severe uncontrolled pain   Complete by: As directed    Call MD for:  temperature >100.4   Complete by: As directed      Allergies as of 03/13/2020   No Known Allergies     Medication List    TAKE these medications   ibuprofen 800 MG tablet Commonly known as: ADVIL Take 1 tablet (800 mg total) by mouth every 6 (six) hours as needed for moderate pain.   oxyCODONE 5 MG immediate release tablet Commonly known as: Oxy IR/ROXICODONE Take 1 tablet (5 mg total) by mouth every 6 (six) hours as needed for severe pain.  Follow-up Information    Fermin Schwab, MD Follow up on 03/15/2020.   Specialty: Obstetrics and Gynecology Contact information: [address] [place]. [place] [address] [phone]               Signed: Carmelina Dane GEFFEL Zadin Lange 03/13/2020, 1:27 PM

## 2020-03-13 NOTE — Progress Notes (Signed)
Patient seen and examined.  Reports persistent upper abdominal pain, but it has steadily improved since admission yesterday.  She denies chest pain or shortness of breath.  Is able to ambulate to the bathroom without feeling dizzy or SOB.  Is voiding normal volumes without difficulty.  Denies lower abdominal pain  Hemoglobin remains stable this AM at 9.    BP 121/67 (BP Location: Right Arm)   Pulse 88   Temp 98 F (36.7 C) (Oral)   Resp 18   Ht 5\' 2"  (1.575 m)   Wt 66.7 kg   SpO2 100%   BMI 26.90 kg/m  NAD Abdomen:  Soft, mild distension.  Bowel sounds present in all quadrants.  Tenderness to palpation in upper abdomen.  No tenderness to deep palpation in lower abdomen, rebound or guarding.    A/P:  Abdominal pain, distension s/p egg retrieval.  Hemoglobin is stable.  She has mild distension, but no nausea, vomiting or other lab abnormalities consistent with OHSS.  Her pain is slowly improving.  Suspect continued pain from intraperitoneal blood following egg retrieval.  Discussed patient with Dr. .  Recommends that we start her on cabergoline 0.5mg  daily to lower the risk of OHSS.  He agrees with the plan to transition her to PO pain medication this AM (no limitation in light of her recent egg retrieval) and he will f/u with her on Monday in his [place] office.    Patient agrees with plan.  Regular diet this AM and transition to PO pain regimen

## 2020-03-29 IMAGING — DX DG CHEST 2V
2 series · 2 of 2 positions shown · non-contrast
Comparison: Chest CT March 09, 2017

CLINICAL DATA: Physical examination

EXAM:
CHEST - 2 VIEW

[chest pa]
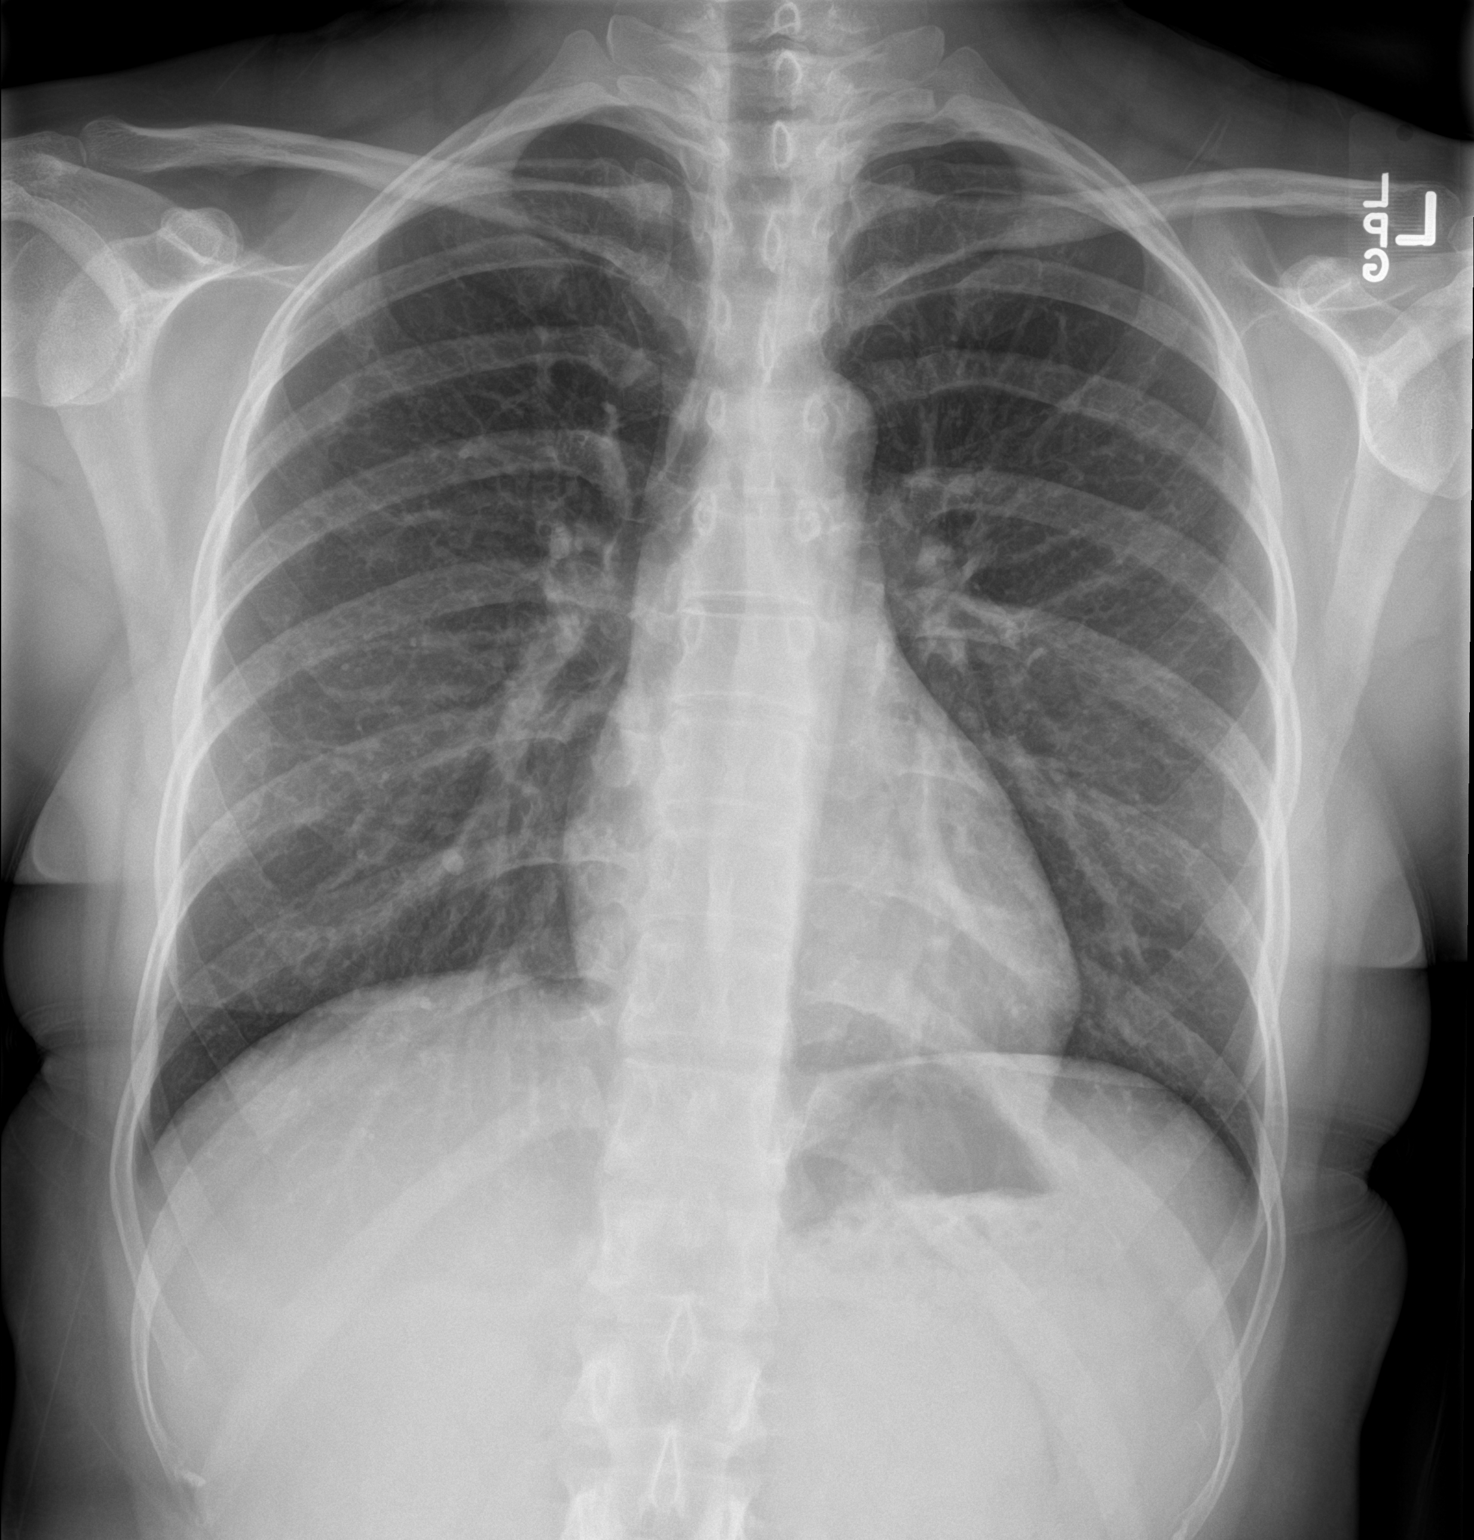

[chest lat]
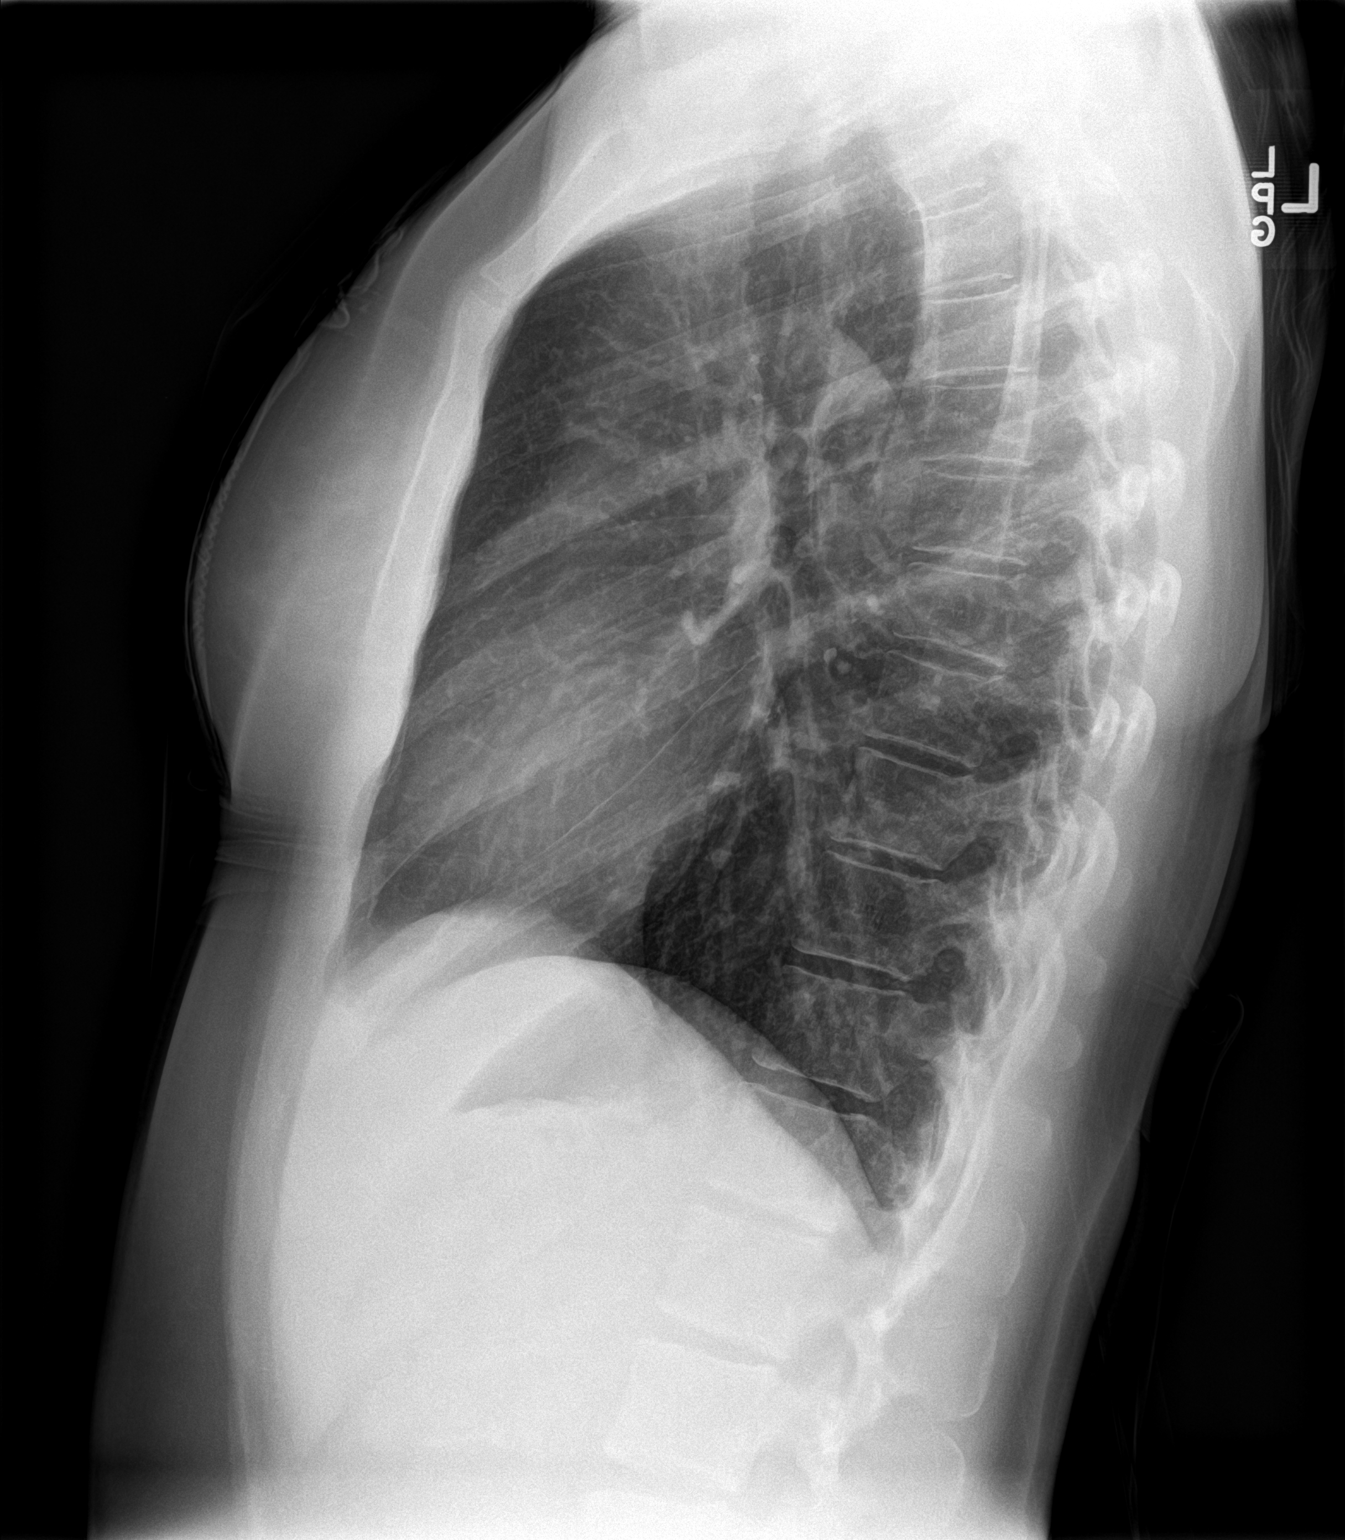

[2 of 2 positions shown; findings below may reference images not displayed]

FINDINGS: Lungs are clear. Heart size and pulmonary vascularity are normal. No
adenopathy. Residua of prior fracture proximal sternum noted. No
acute fracture evident. No pneumothorax.
IMPRESSION: Evidence of prior proximal sternal fracture. Lungs clear. No
adenopathy evident.

## 2020-06-29 DIAGNOSIS — O09819 Supervision of pregnancy resulting from assisted reproductive technology, unspecified trimester: Secondary | ICD-10-CM | POA: Insufficient documentation

## 2020-06-29 LAB — OB RESULTS CONSOLE GC/CHLAMYDIA
Chlamydia: NEGATIVE
Gonorrhea: NEGATIVE

## 2020-06-29 LAB — OB RESULTS CONSOLE ANTIBODY SCREEN: Antibody Screen: NEGATIVE

## 2020-06-29 LAB — OB RESULTS CONSOLE ABO/RH: RH Type: POSITIVE

## 2020-06-29 LAB — OB RESULTS CONSOLE RUBELLA ANTIBODY, IGM: Rubella: IMMUNE

## 2020-06-29 LAB — OB RESULTS CONSOLE HEPATITIS B SURFACE ANTIGEN: Hepatitis B Surface Ag: NEGATIVE

## 2020-06-29 LAB — OB RESULTS CONSOLE HIV ANTIBODY (ROUTINE TESTING): HIV: NONREACTIVE

## 2020-09-07 ENCOUNTER — Ambulatory Visit (HOSPITAL_COMMUNITY)
Admission: EM | Admit: 2020-09-07 | Discharge: 2020-09-07 | Disposition: A | Discharge status: Home / Self Care | Payer: BC Managed Care – PPO | Attending: Emergency Medicine | Admitting: Emergency Medicine

## 2020-09-07 ENCOUNTER — Encounter (HOSPITAL_COMMUNITY): Payer: Self-pay

## 2020-09-07 ENCOUNTER — Other Ambulatory Visit: Payer: Self-pay

## 2020-09-07 DIAGNOSIS — S61239A Puncture wound without foreign body of unspecified finger without damage to nail, initial encounter: Secondary | ICD-10-CM | POA: Diagnosis not present

## 2020-09-07 DIAGNOSIS — W540XXA Bitten by dog, initial encounter: Secondary | ICD-10-CM

## 2020-09-07 MED ORDER — CLINDAMYCIN HCL 300 MG PO CAPS: 300.0000 mg | ORAL_CAPSULE | Freq: Three times a day (TID) | ORAL | 0 refills | Status: DC

## 2020-09-07 MED ORDER — PENICILLIN V POTASSIUM 500 MG PO TABS
500.0000 mg | ORAL_TABLET | Freq: Four times a day (QID) | ORAL | 0 refills | Status: DC
Start: 2020-09-07 — End: 2020-12-06

## 2020-09-07 NOTE — ED Provider Notes (Signed)
[facility]    CSN: [id number] Arrival date & time: 09/07/20  1137      History   Chief Complaint Chief Complaint  Patient presents with   Animal Bite    HPI Tomisha Reppucci is a 35 y.o. female.   Patient presents with right thumb pain and swelling for 3 days occurring after dog bite.  Dog nonaggressive, bite unintentional, up-to-date on vaccines.  Unable to bend down.  Endorses decreased sensation.  Currently [redacted] weeks gestation.  No cardiac history.    Past Medical History:  Diagnosis Date   Chickenpox    Sinusitis    Sore throat     Patient Active Problem List   Diagnosis Date Noted   Pelvic hematoma, female 03/12/2020   Mild concussion 03/28/2017   Pain of right great toe 09/13/2016   CHICKENPOX, HX OF 02/25/2007    Past Surgical History:  Procedure Laterality Date   TONSILLECTOMY      OB History   No obstetric history on file.      Home Medications    Prior to Admission medications   Medication Sig Start Date End Date Taking? Authorizing Provider  clindamycin (CLEOCIN) 300 MG capsule Take 1 capsule (300 mg total) by mouth 3 (three) times daily. 09/07/20  Yes Darryel Diodato R, NP  penicillin v potassium (VEETID) 500 MG tablet Take 1 tablet (500 mg total) by mouth 4 (four) times daily. 09/07/20  Yes Zaniah Titterington R, NP  ibuprofen (ADVIL) 800 MG tablet Take 1 tablet (800 mg total) by mouth every 6 (six) hours as needed for moderate pain. 03/13/20   Marlow Baars, MD  oxyCODONE (OXY IR/ROXICODONE) 5 MG immediate release tablet Take 1 tablet (5 mg total) by mouth every 6 (six) hours as needed for severe pain. 03/13/20   Marlow Baars, MD    Family History Family History  Problem Relation Age of Onset   Hypothyroidism Mother    Diabetes Paternal Grandfather     Social History Social History   Tobacco Use   Smoking status: Never   Smokeless tobacco: Never  Substance Use Topics   Alcohol use: Yes    Alcohol/week: 0.0 standard drinks     Comment: very little   Drug use: No     Allergies   Patient has no known allergies.   Review of Systems Review of Systems  Constitutional: Negative.   Respiratory: Negative.    Cardiovascular: Negative.   Musculoskeletal:  Positive for joint swelling. Negative for arthralgias, back pain, gait problem, myalgias, neck pain and neck stiffness.  Skin: Negative.     Physical Exam Triage Vital Signs ED Triage Vitals  Enc Vitals Group     BP 09/07/20 1230 124/80     Pulse Rate 09/07/20 1230 96     Resp 09/07/20 1230 18     Temp 09/07/20 1230 98.5 F (36.9 C)     Temp Source 09/07/20 1230 Oral     SpO2 09/07/20 1230 100 %     Weight --      Height --      Head Circumference --      Peak Flow --      Pain Score 09/07/20 1228 6     Pain Loc --      Pain Edu? --      Excl. in GC? --    No data found.  Updated Vital Signs BP 124/80 (BP Location: Right Arm)   Pulse 96   Temp 98.5  F (36.9 C) (Oral)   Resp 18   SpO2 100%   Visual Acuity Right Eye Distance:   Left Eye Distance:   Bilateral Distance:    Right Eye Near:   Left Eye Near:    Bilateral Near:     Physical Exam Constitutional:      Appearance: Normal appearance. She is normal weight.  HENT:     Head: Normocephalic.  Eyes:     Extraocular Movements: Extraocular movements intact.  Pulmonary:     Effort: Pulmonary effort is normal.  Musculoskeletal:     Comments: Puncture wound present on the palmar aspect of the distal phalanx and medial aspect of the right, tenderness puncture site with moderate swelling and mild erythema, passive range of motion intact, sensation intact, 2+ radial pulse  Skin:    General: Skin is warm and dry.  Neurological:     Mental Status: She is alert and oriented to person, place, and time. Mental status is at baseline.  Psychiatric:        Mood and Affect: Mood normal.        Behavior: Behavior normal.     UC Treatments / Results  Labs (all labs ordered are listed,  but only abnormal results are displayed) Labs Reviewed - No data to display  EKG   Radiology No results found.  Procedures Procedures (including critical care time)  Medications Ordered in UC Medications - No data to display  Initial Impression / Assessment and Plan / UC Course  I have reviewed the triage vital signs and the nursing notes.  Pertinent labs & imaging results that were available during my care of the patient were reviewed by me and considered in my medical decision making (see chart for details).  Puncture wound finger Dog bite  1.  Penicillin V 500 mg 4 times daily for 5 days 2. Clindamycin 300 mg tid for 5 days 3. Otc tylenol for pain as needed 4. Ice prn in 5-10 intervals prn  5. Given return precautions for worsening signs of infection for follow up at urgent care or with PCP Final Clinical Impressions(s) / UC Diagnoses   Final diagnoses:  Puncture wound of finger, initial encounter  Dog bite, initial encounter     Discharge Instructions      Take penicillin 4 times a day for 5 days  Take clindamycin 3 times a day for 5 days  Please follow-up for increased pain, increased swelling, fever, chills, drainage at urgent care or with primary care doctor  Can place ice for 15 minutes as tolerated to help with pain  Can continue use of Tylenol as needed for pain   ED Prescriptions     Medication Sig Dispense Auth. Provider   penicillin v potassium (VEETID) 500 MG tablet Take 1 tablet (500 mg total) by mouth 4 (four) times daily. 20 tablet Imaan Padgett R, NP   clindamycin (CLEOCIN) 300 MG capsule Take 1 capsule (300 mg total) by mouth 3 (three) times daily. 15 capsule Moncerrath Berhe, Elita Boone, NP      PDMP not reviewed this encounter.   Valinda Hoar, [place] 09/07/20 [phone]

## 2020-09-07 NOTE — Discharge Instructions (Addendum)
Take penicillin 4 times a day for 5 days  Take clindamycin 3 times a day for 5 days  Please follow-up for increased pain, increased swelling, fever, chills, drainage at urgent care or with primary care doctor  Can place ice for 15 minutes as tolerated to help with pain  Can continue use of Tylenol as needed for pain

## 2020-09-07 NOTE — ED Triage Notes (Signed)
Pt reports pain and swelling  in the right thumb x 2 days. States her dog bite her and she is concern as she is [redacted] weeks pregnant. Pt reports dog vaccines are up to date.

## 2020-10-14 LAB — OB RESULTS CONSOLE RPR: RPR: NONREACTIVE

## 2020-12-06 ENCOUNTER — Inpatient Hospital Stay (HOSPITAL_COMMUNITY)
Admission: AD | Admit: 2020-12-06 | Discharge: 2020-12-06 | Disposition: A | Discharge status: Home / Self Care | Payer: BC Managed Care – PPO | Attending: Obstetrics and Gynecology | Admitting: Obstetrics and Gynecology

## 2020-12-06 ENCOUNTER — Encounter (HOSPITAL_COMMUNITY): Payer: Self-pay | Admitting: *Deleted

## 2020-12-06 DIAGNOSIS — R030 Elevated blood-pressure reading, without diagnosis of hypertension: Secondary | ICD-10-CM

## 2020-12-06 DIAGNOSIS — Z3A35 35 weeks gestation of pregnancy: Secondary | ICD-10-CM

## 2020-12-06 DIAGNOSIS — O26893 Other specified pregnancy related conditions, third trimester: Secondary | ICD-10-CM | POA: Diagnosis not present

## 2020-12-06 DIAGNOSIS — R109 Unspecified abdominal pain: Secondary | ICD-10-CM | POA: Insufficient documentation

## 2020-12-06 DIAGNOSIS — R519 Headache, unspecified: Secondary | ICD-10-CM

## 2020-12-06 DIAGNOSIS — R600 Localized edema: Secondary | ICD-10-CM | POA: Insufficient documentation

## 2020-12-06 LAB — CBC
HCT: 35.3 % — ABNORMAL LOW (ref 36.0–46.0)
Hemoglobin: 11.5 g/dL — ABNORMAL LOW (ref 12.0–15.0)
MCH: 29.3 pg (ref 26.0–34.0)
MCHC: 32.6 g/dL (ref 30.0–36.0)
MCV: 89.8 fL (ref 80.0–100.0)
Platelets: 197 10*3/uL (ref 150–400)
RBC: 3.93 MIL/uL (ref 3.87–5.11)
RDW: 12.8 % (ref 11.5–15.5)
WBC: 11.3 10*3/uL — ABNORMAL HIGH (ref 4.0–10.5)
nRBC: 0 % (ref 0.0–0.2)

## 2020-12-06 LAB — COMPREHENSIVE METABOLIC PANEL
ALT: 16 U/L (ref 0–44)
AST: 21 U/L (ref 15–41)
Albumin: 2.3 g/dL — ABNORMAL LOW (ref 3.5–5.0)
Alkaline Phosphatase: 117 U/L (ref 38–126)
Anion gap: 7 (ref 5–15)
BUN: 5 mg/dL — ABNORMAL LOW (ref 6–20)
CO2: 20 mmol/L — ABNORMAL LOW (ref 22–32)
Calcium: 8.6 mg/dL — ABNORMAL LOW (ref 8.9–10.3)
Chloride: 108 mmol/L (ref 98–111)
Creatinine, Ser: 0.63 mg/dL (ref 0.44–1.00)
GFR, Estimated: >60 mL/min (ref >60)
Glucose, Bld: 88 mg/dL (ref 70–99)
Potassium: 4 mmol/L (ref 3.5–5.1)
Sodium: 135 mmol/L (ref 135–145)
Total Bilirubin: 0.4 mg/dL (ref 0.3–1.2)
Total Protein: 5.5 g/dL — ABNORMAL LOW (ref 6.5–8.1)

## 2020-12-06 LAB — PROTEIN / CREATININE RATIO, URINE
Creatinine, Urine: 108.09 mg/dL
Protein Creatinine Ratio: 0.13 mg/mg{Cre} (ref 0.00–0.15)
Total Protein, Urine: 14 mg/dL

## 2020-12-06 LAB — URINALYSIS, ROUTINE W REFLEX MICROSCOPIC
Bilirubin Urine: NEGATIVE
Glucose, UA: NEGATIVE mg/dL
Hgb urine dipstick: NEGATIVE
Ketones, ur: NEGATIVE mg/dL
Leukocytes,Ua: NEGATIVE
Nitrite: NEGATIVE
Protein, ur: NEGATIVE mg/dL
Specific Gravity, Urine: 1.013 (ref 1.005–1.030)
pH: 7 (ref 5.0–8.0)

## 2020-12-06 MED ORDER — ACETAMINOPHEN 500 MG PO TABS
1000.0000 mg | ORAL_TABLET | Freq: Once | ORAL | Status: AC
  Administered 2020-12-06: 1000 mg via ORAL
  Filled 2020-12-06: qty 2

## 2020-12-06 NOTE — MAU Provider Note (Signed)
History     CSN: [id number]  Arrival date and time: 12/06/20 1016   Event Date/Time   First Provider Initiated Contact with Patient 12/06/20 1137      Chief Complaint  Patient presents with   Abdominal Pain   Headache   Leg Swelling   Foot Swelling   Hypertension   HPI  Jill Willis is a 35 y.o. female G1P0 @ [redacted]w[redacted]d here in MAU with concerns about her BP being elevated. Normal BP for her is 110/70. She reports swelling in bilateral legs/feet over the wkd. Her husband check her BP and it read 120/80's, then 130's/90's on Sunday.  This is higher than what she normally runs.   No scotoma.  + headache which started Saturday. She also had another one Sunday and then this morning. She did take tylenol 1 pill on Saturday which helped a little bit. She hasnt taken anything today. She rates her HA 3/10. The headache is located in the front of her head.   OB History     Gravida  1   Para      Term      Preterm      AB      Living         SAB      IAB      Ectopic      Multiple      Live Births              Past Medical History:  Diagnosis Date   Chickenpox    Sinusitis    Sore throat     Past Surgical History:  Procedure Laterality Date   egg retrieval     TONSILLECTOMY      Family History  Problem Relation Age of Onset   Hypothyroidism Mother    Diabetes Paternal Grandfather     Social History   Tobacco Use   Smoking status: Never   Smokeless tobacco: Never  Vaping Use   Vaping Use: Never used  Substance Use Topics   Alcohol use: Not Currently    Comment: very little   Drug use: No    Allergies: No Known Allergies  Medications Prior to Admission  Medication Sig Dispense Refill Last Dose   Prenatal Vit-Fe Fumarate-FA (PRENATAL MULTIVITAMIN) TABS tablet Take 1 tablet by mouth daily at 12 noon.   12/06/2020   clindamycin (CLEOCIN) 300 MG capsule Take 1 capsule (300 mg total) by mouth 3 (three) times daily. 15 capsule 0     ibuprofen (ADVIL) 800 MG tablet Take 1 tablet (800 mg total) by mouth every 6 (six) hours as needed for moderate pain. 30 tablet 0    oxyCODONE (OXY IR/ROXICODONE) 5 MG immediate release tablet Take 1 tablet (5 mg total) by mouth every 6 (six) hours as needed for severe pain. 12 tablet 0    penicillin v potassium (VEETID) 500 MG tablet Take 1 tablet (500 mg total) by mouth 4 (four) times daily. 20 tablet 0    Results for orders placed or performed during the hospital encounter of 12/06/20 (from the past 48 hour(s))  Urinalysis, Routine w reflex microscopic Urine, Clean Catch     Status: Abnormal   Collection Time: 12/06/20 10:43 AM  Result Value Ref Range   Color, Urine YELLOW YELLOW   APPearance HAZY (A) CLEAR   Specific Gravity, Urine 1.013 1.005 - 1.030   pH 7.0 5.0 - 8.0   Glucose, UA NEGATIVE NEGATIVE mg/dL   Hgb  urine dipstick NEGATIVE NEGATIVE   Bilirubin Urine NEGATIVE NEGATIVE   Ketones, ur NEGATIVE NEGATIVE mg/dL   Protein, ur NEGATIVE NEGATIVE mg/dL   Nitrite NEGATIVE NEGATIVE   Leukocytes,Ua NEGATIVE NEGATIVE    Comment: Performed at [facility] [address] [place], [place] [address]  CBC     Status: Abnormal   Collection Time: 12/06/20 11:44 AM  Result Value Ref Range   WBC 11.3 (H) 4.0 - 10.5 K/uL   RBC 3.93 3.87 - 5.11 MIL/uL   Hemoglobin 11.5 (L) 12.0 - 15.0 g/dL   HCT 35.3 (L) 36.0 - 46.0 %   MCV 89.8 80.0 - 100.0 fL   MCH 29.3 26.0 - 34.0 pg   MCHC 32.6 30.0 - 36.0 g/dL   RDW 12.8 11.5 - 15.5 %   Platelets 197 150 - 400 K/uL   nRBC 0.0 0.0 - 0.2 %    Comment: Performed at [facility], [place] [address] [place], [place] [address]  Comprehensive metabolic panel     Status: Abnormal   Collection Time: 12/06/20 11:44 AM  Result Value Ref Range   Sodium 135 135 - 145 mmol/L   Potassium 4.0 3.5 - 5.1 mmol/L   Chloride 108 98 - 111 mmol/L   CO2 20 (L) 22 - 32 mmol/L   Glucose, Bld 88 70 - 99 mg/dL    Comment: Glucose reference range applies only  to samples taken after fasting for at least 8 hours.   BUN 5 (L) 6 - 20 mg/dL   Creatinine, Ser 0.63 0.44 - 1.00 mg/dL   Calcium 8.6 (L) 8.9 - 10.3 mg/dL   Total Protein 5.5 (L) 6.5 - 8.1 g/dL   Albumin 2.3 (L) 3.5 - 5.0 g/dL   AST 21 15 - 41 U/L   ALT 16 0 - 44 U/L   Alkaline Phosphatase 117 38 - 126 U/L   Total Bilirubin 0.4 0.3 - 1.2 mg/dL   GFR, Estimated >60 >60 mL/min    Comment: (NOTE) Calculated using the CKD-EPI Creatinine Equation (2021)    Anion gap 7 5 - 15    Comment: Performed at [facility], [place] [address], [place], [place] [address]  Protein / creatinine ratio, urine     Status: None   Collection Time: 12/06/20 12:00 PM  Result Value Ref Range   Creatinine, Urine 108.09 mg/dL   Total Protein, Urine 14 mg/dL    Comment: NO NORMAL RANGE ESTABLISHED FOR THIS TEST   Protein Creatinine Ratio 0.13 0.00 - 0.15 mg/mg[Cre]    Comment: Performed at [facility] [address] [place], [place] [address]    Review of Systems  Eyes:  Negative for photophobia and visual disturbance.  Gastrointestinal:  Negative for abdominal pain.  Musculoskeletal:  Positive for joint swelling.  Neurological:  Positive for headaches.  Physical Exam   Blood pressure 132/85, pulse (!) 101, temperature 98.8 F (37.1 C), temperature source Oral, resp. rate 17, SpO2 98 %.  Patient Vitals for the past 24 hrs:  BP Temp Temp src Pulse Resp SpO2  12/06/20 1330 134/90 -- -- 78 -- 99 %  12/06/20 1315 115/85 -- -- 88 -- --  12/06/20 1300 124/75 -- -- 77 -- --  12/06/20 1245 122/87 -- -- 75 -- --  12/06/20 1230 125/87 -- -- 80 -- 99 %  12/06/20 1215 124/88 -- -- 87 -- 99 %  12/06/20 1145 (!) 128/93 -- -- 89 -- 99 %  12/06/20 1130 132/85 -- -- (!) 101 -- 98 %  12/06/20 1115 126/87 -- -- 86 -- 99 %  12/06/20 1100 131/90 -- -- 100 -- 98 %  12/06/20 1051 134/90 98.8 F (37.1 C) Oral 95 17 98 %     Physical Exam Vitals and nursing note reviewed.  Constitutional:      General: She  is not in acute distress.    Appearance: She is well-developed. She is not ill-appearing, toxic-appearing or diaphoretic.  HENT:     Head: Normocephalic.  Eyes:     Pupils: Pupils are equal, round, and reactive to light.  Musculoskeletal:        General: No swelling.  Skin:    General: Skin is warm.  Neurological:     Mental Status: She is alert and oriented to person, place, and time.     Deep Tendon Reflexes: Reflexes normal.     Comments: Negative clonus    Fetal Tracing: Baseline: 130 bpm Variability: Moderate  Accelerations: 15x15 Decelerations: None Toco: UI  MAU Course  Procedures  MDM  PIH labs completed Tylenol given 1000 mg. HA pain 0/10 Discussed patient's presence with Dr. Brien Mates, patient needs a f/u BP in the office this week and she will arrange for this.   Assessment and Plan   A:  1. Elevated BP without diagnosis of hypertension   2. [redacted] weeks gestation of pregnancy   3. Mild headache      P:  Discharge home in stable condition Return to MAU if symptoms worsen BP check this week in the office Pre E precautions Elevated feet occasionally.   Jill Saupe I, NP 12/06/2020 3:05 PM

## 2020-12-06 NOTE — MAU Note (Addendum)
...  Jill Willis is a 35 y.o. at [redacted]w[redacted]d here in MAU reporting: bilateral leg and foot edema since this past Friday. She states her BP was elevated last night and was 138/94 at home. She states the day prior it was in the 120's/80's. She states she has had a HA for a couple of days as well but has not taken anything for it today. She states she had 500 mg of Tylenol at 1945 last night and it alleviated her HA just a little. She is also endorsing RUQ pain that has been going on for one month now, she states her OB is aware. She states it feels as if someone is continually poking her. Denies any visual disturbances. +FM. No VB or LOF.  Pain score: 3/10 HA 0/10 RUQ pain - doesn't always occur  Lab orders placed from triage: UA

## 2020-12-16 ENCOUNTER — Encounter (HOSPITAL_COMMUNITY): Payer: Self-pay | Admitting: Obstetrics

## 2020-12-16 ENCOUNTER — Inpatient Hospital Stay (HOSPITAL_COMMUNITY)
Admission: AD | Admit: 2020-12-16 | Discharge: 2020-12-16 | Disposition: A | Discharge status: Home / Self Care | Payer: BC Managed Care – PPO | Source: Home / Self Care | Attending: Obstetrics | Admitting: Obstetrics

## 2020-12-16 ENCOUNTER — Other Ambulatory Visit: Payer: Self-pay

## 2020-12-16 DIAGNOSIS — O134 Gestational [pregnancy-induced] hypertension without significant proteinuria, complicating childbirth: Secondary | ICD-10-CM | POA: Diagnosis not present

## 2020-12-16 DIAGNOSIS — R1011 Right upper quadrant pain: Secondary | ICD-10-CM | POA: Insufficient documentation

## 2020-12-16 DIAGNOSIS — Z3A36 36 weeks gestation of pregnancy: Secondary | ICD-10-CM | POA: Insufficient documentation

## 2020-12-16 DIAGNOSIS — R030 Elevated blood-pressure reading, without diagnosis of hypertension: Secondary | ICD-10-CM | POA: Insufficient documentation

## 2020-12-16 DIAGNOSIS — R519 Headache, unspecified: Secondary | ICD-10-CM | POA: Insufficient documentation

## 2020-12-16 DIAGNOSIS — O26893 Other specified pregnancy related conditions, third trimester: Secondary | ICD-10-CM | POA: Insufficient documentation

## 2020-12-16 LAB — COMPREHENSIVE METABOLIC PANEL
ALT: 16 U/L (ref 0–44)
AST: 21 U/L (ref 15–41)
Albumin: 2.3 g/dL — ABNORMAL LOW (ref 3.5–5.0)
Alkaline Phosphatase: 132 U/L — ABNORMAL HIGH (ref 38–126)
Anion gap: 7 (ref 5–15)
BUN: 8 mg/dL (ref 6–20)
CO2: 21 mmol/L — ABNORMAL LOW (ref 22–32)
Calcium: 8.5 mg/dL — ABNORMAL LOW (ref 8.9–10.3)
Chloride: 107 mmol/L (ref 98–111)
Creatinine, Ser: 0.76 mg/dL (ref 0.44–1.00)
GFR, Estimated: >60 mL/min (ref >60)
Glucose, Bld: 95 mg/dL (ref 70–99)
Potassium: 3.8 mmol/L (ref 3.5–5.1)
Sodium: 135 mmol/L (ref 135–145)
Total Bilirubin: 0.4 mg/dL (ref 0.3–1.2)
Total Protein: 5.3 g/dL — ABNORMAL LOW (ref 6.5–8.1)

## 2020-12-16 LAB — CBC
HCT: 33.6 % — ABNORMAL LOW (ref 36.0–46.0)
Hemoglobin: 11 g/dL — ABNORMAL LOW (ref 12.0–15.0)
MCH: 29.1 pg (ref 26.0–34.0)
MCHC: 32.7 g/dL (ref 30.0–36.0)
MCV: 88.9 fL (ref 80.0–100.0)
Platelets: 208 10*3/uL (ref 150–400)
RBC: 3.78 MIL/uL — ABNORMAL LOW (ref 3.87–5.11)
RDW: 13.1 % (ref 11.5–15.5)
WBC: 12.3 10*3/uL — ABNORMAL HIGH (ref 4.0–10.5)
nRBC: 0 % (ref 0.0–0.2)

## 2020-12-16 LAB — URINALYSIS, ROUTINE W REFLEX MICROSCOPIC
Bilirubin Urine: NEGATIVE
Glucose, UA: NEGATIVE mg/dL
Hgb urine dipstick: NEGATIVE
Ketones, ur: NEGATIVE mg/dL
Leukocytes,Ua: NEGATIVE
Nitrite: NEGATIVE
Protein, ur: NEGATIVE mg/dL
Specific Gravity, Urine: 1.01 (ref 1.005–1.030)
pH: 7 (ref 5.0–8.0)

## 2020-12-16 LAB — PROTEIN / CREATININE RATIO, URINE
Creatinine, Urine: 80.29 mg/dL
Protein Creatinine Ratio: 0.11 mg/mg{Cre} (ref 0.00–0.15)
Total Protein, Urine: 9 mg/dL

## 2020-12-16 NOTE — MAU Provider Note (Addendum)
History     CSN: [id number]  Arrival date and time: 12/16/20 1648   Event Date/Time   First Provider Initiated Contact with Patient 12/16/20 1742      Chief Complaint  Patient presents with   Hypertension   Jill Willis is a 35 y.o. G1P0 at [redacted]w[redacted]d who receives care at [facility].  She presents today for Hypertension.  She presents direct from office for elevated blood pressures.  Patient states she has had no problems during the pregnancy.  She endorses fetal movement and denies current abdominal cramping or contractions.  She also denies vaginal concerns including bleeding, discharge, or leaking. She reports a HA that started after being informed of elevated bp.  She admits to some crying which she feels may contribute.  She reports the HA is located in the frontal area and describes it as "a slight headache" and does not feel she needs anything for it. She reports some RUQ that has been present for about one month.  She states it was initially with laying down, but is now constant stabbing pain.  She rates the pain a 3/10 today, but it was a 7/10 yesterday.    OB History     Gravida  1   Para      Term      Preterm      AB      Living         SAB      IAB      Ectopic      Multiple      Live Births              Past Medical History:  Diagnosis Date   Chickenpox    Sinusitis    Sore throat     Past Surgical History:  Procedure Laterality Date   egg retrieval     TONSILLECTOMY      Family History  Problem Relation Age of Onset   Hypothyroidism Mother    Diabetes Paternal Grandfather     Social History   Tobacco Use   Smoking status: Never   Smokeless tobacco: Never  Vaping Use   Vaping Use: Never used  Substance Use Topics   Alcohol use: Not Currently    Comment: very little   Drug use: No    Allergies: No Known Allergies  Medications Prior to Admission  Medication Sig Dispense Refill Last Dose   Prenatal Vit-Fe  Fumarate-FA (PRENATAL MULTIVITAMIN) TABS tablet Take 1 tablet by mouth daily at 12 noon.       Review of Systems  Eyes:  Negative for visual disturbance.  Respiratory:  Negative for cough, chest tightness and shortness of breath.   Cardiovascular:  Negative for chest pain.  Gastrointestinal:  Negative for nausea and vomiting.  Genitourinary:  Negative for vaginal bleeding and vaginal discharge.  Neurological:  Positive for headaches. Negative for dizziness and light-headedness.  Physical Exam   Blood pressure 133/89, pulse 86, temperature 98.4 F (36.9 C), resp. rate 16, SpO2 98 %.  Vitals:   12/16/20 1720 12/16/20 1725 12/16/20 1730 12/16/20 1731  BP:    133/89  Pulse:    86  Resp:      Temp:      SpO2: 98% 98% 98%     Physical Exam Constitutional:      Appearance: Normal appearance.  HENT:     Head: Normocephalic and atraumatic.  Eyes:     Conjunctiva/sclera: Conjunctivae normal.  Cardiovascular:  Rate and Rhythm: Normal rate and regular rhythm.     Heart sounds: Normal heart sounds.  Pulmonary:     Effort: Pulmonary effort is normal. No respiratory distress.     Breath sounds: Normal breath sounds.  Chest:     Comments: No tenderness, to palpation, in RUQ Abdominal:     General: Bowel sounds are normal.  Musculoskeletal:        General: Normal range of motion.     Cervical back: Normal range of motion.     Right lower leg: No tenderness. No edema.     Left lower leg: No tenderness. Pitting Edema present.     Right foot: Swelling present.     Left foot: Swelling present.     Comments: Negative Homans   Skin:    General: Skin is warm and dry.  Neurological:     Mental Status: She is alert and oriented to person, place, and time.  Psychiatric:        Mood and Affect: Mood normal.        Behavior: Behavior normal.        Thought Content: Thought content normal.    Fetal Assessment 135 bpm, Mod Var, -Decels, +15x15 Accels Toco: Irritability  MAU Course    Results for orders placed or performed during the hospital encounter of 12/16/20 (from the past 24 hour(s))  Urinalysis, Routine w reflex microscopic Urine, Clean Catch     Status: None   Collection Time: 12/16/20  5:06 PM  Result Value Ref Range   Color, Urine YELLOW YELLOW   APPearance CLEAR CLEAR   Specific Gravity, Urine 1.010 1.005 - 1.030   pH 7.0 5.0 - 8.0   Glucose, UA NEGATIVE NEGATIVE mg/dL   Hgb urine dipstick NEGATIVE NEGATIVE   Bilirubin Urine NEGATIVE NEGATIVE   Ketones, ur NEGATIVE NEGATIVE mg/dL   Protein, ur NEGATIVE NEGATIVE mg/dL   Nitrite NEGATIVE NEGATIVE   Leukocytes,Ua NEGATIVE NEGATIVE  Protein / creatinine ratio, urine     Status: None   Collection Time: 12/16/20  5:07 PM  Result Value Ref Range   Creatinine, Urine 80.29 mg/dL   Total Protein, Urine 9 mg/dL   Protein Creatinine Ratio 0.11 0.00 - 0.15 mg/mg[Cre]  CBC     Status: Abnormal   Collection Time: 12/16/20  5:14 PM  Result Value Ref Range   WBC 12.3 (H) 4.0 - 10.5 K/uL   RBC 3.78 (L) 3.87 - 5.11 MIL/uL   Hemoglobin 11.0 (L) 12.0 - 15.0 g/dL   HCT 57.833.6 (L) 46.936.0 - 62.946.0 %   MCV 88.9 80.0 - 100.0 fL   MCH 29.1 26.0 - 34.0 pg   MCHC 32.7 30.0 - 36.0 g/dL   RDW 52.813.1 41.311.5 - 24.415.5 %   Platelets 208 150 - 400 K/uL   nRBC 0.0 0.0 - 0.2 %  Comprehensive metabolic panel     Status: Abnormal   Collection Time: 12/16/20  5:14 PM  Result Value Ref Range   Sodium 135 135 - 145 mmol/L   Potassium 3.8 3.5 - 5.1 mmol/L   Chloride 107 98 - 111 mmol/L   CO2 21 (L) 22 - 32 mmol/L   Glucose, Bld 95 70 - 99 mg/dL   BUN 8 6 - 20 mg/dL   Creatinine, Ser 0.100.76 0.44 - 1.00 mg/dL   Calcium 8.5 (L) 8.9 - 10.3 mg/dL   Total Protein 5.3 (L) 6.5 - 8.1 g/dL   Albumin 2.3 (L) 3.5 - 5.0 g/dL  AST 21 15 - 41 U/L   ALT 16 0 - 44 U/L   Alkaline Phosphatase 132 (H) 38 - 126 U/L   Total Bilirubin 0.4 0.3 - 1.2 mg/dL   GFR, Estimated >60 >60 mL/min   Anion gap 7 5 - 15   No results found.  MDM Physical Exam Labs:  CBC, CMP, PC Ratio Measure BPQ15 min EFM Assessment and Plan  35 year old G1P0  SIUP at 36.6 weeks Cat I FT Elevated BP  -Prenatal records reviewed. RUQ pain complaint noted at 27.6 weeks visit. -POC Reviewed. -Exam performed. -Labs ordered -Pain medication offered and declined. -Will await results and manage as appropriate.  Maryann Conners MSN, CNM 12/16/2020, 5:42 PM   Reassessment (6:54 PM) Vitals:   12/16/20 1800 12/16/20 1815 12/16/20 1831 12/16/20 1846  BP: (!) 127/91 126/84 128/89 130/89  Pulse: 86 83 82 78  Resp:      Temp:      SpO2: 98% 99%      -Labs return normal. -Dr. Carloyn Jaeger called and informed of findings and overall normotensive bps. *Requests patient be informed of need for IOL tomorrow morning s/t GHTN. -Provider to bedside to discuss findings and POC. -Patient informed provider unaware of when L&D team will call for IOL. -No other questions. -Encouraged to call primary office or return to MAU if symptoms worsen or with the onset of new symptoms. -Discharged to home in stable condition.  Maryann Conners MSN, CNM Advanced Practice Provider, [facility]

## 2020-12-16 NOTE — MAU Note (Signed)
Pt reports she was in the office today. She states her blood pressure was up and her baby failed the NST. Pt reports they did an ultrasound and everything looked good, but the baby must have been tired.   Pt reports pain under her last right rib.

## 2020-12-17 ENCOUNTER — Inpatient Hospital Stay (HOSPITAL_COMMUNITY): Payer: BC Managed Care – PPO

## 2020-12-17 ENCOUNTER — Inpatient Hospital Stay (HOSPITAL_COMMUNITY)
Admission: AD | Admit: 2020-12-17 | Discharge: 2020-12-21 | DRG: 805 | Disposition: A | Discharge status: Home / Self Care | Payer: BC Managed Care – PPO | Attending: Obstetrics and Gynecology | Admitting: Obstetrics and Gynecology

## 2020-12-17 ENCOUNTER — Encounter (HOSPITAL_COMMUNITY): Payer: Self-pay | Admitting: Obstetrics and Gynecology

## 2020-12-17 DIAGNOSIS — O43123 Velamentous insertion of umbilical cord, third trimester: Secondary | ICD-10-CM | POA: Diagnosis present

## 2020-12-17 DIAGNOSIS — Z20822 Contact with and (suspected) exposure to covid-19: Secondary | ICD-10-CM | POA: Diagnosis present

## 2020-12-17 DIAGNOSIS — Z3A37 37 weeks gestation of pregnancy: Secondary | ICD-10-CM

## 2020-12-17 DIAGNOSIS — O1490 Unspecified pre-eclampsia, unspecified trimester: Secondary | ICD-10-CM | POA: Diagnosis present

## 2020-12-17 DIAGNOSIS — M25552 Pain in left hip: Secondary | ICD-10-CM | POA: Diagnosis not present

## 2020-12-17 DIAGNOSIS — O134 Gestational [pregnancy-induced] hypertension without significant proteinuria, complicating childbirth: Principal | ICD-10-CM | POA: Diagnosis present

## 2020-12-17 DIAGNOSIS — O411230 Chorioamnionitis, third trimester, not applicable or unspecified: Secondary | ICD-10-CM | POA: Diagnosis present

## 2020-12-17 DIAGNOSIS — O26893 Other specified pregnancy related conditions, third trimester: Secondary | ICD-10-CM | POA: Diagnosis not present

## 2020-12-17 HISTORY — DX: Unspecified pre-eclampsia, unspecified trimester: O14.90

## 2020-12-17 LAB — COMPREHENSIVE METABOLIC PANEL
ALT: 16 U/L (ref 0–44)
AST: 22 U/L (ref 15–41)
Albumin: 2.2 g/dL — ABNORMAL LOW (ref 3.5–5.0)
Alkaline Phosphatase: 134 U/L — ABNORMAL HIGH (ref 38–126)
Anion gap: 6 (ref 5–15)
BUN: 7 mg/dL (ref 6–20)
CO2: 20 mmol/L — ABNORMAL LOW (ref 22–32)
Calcium: 8.2 mg/dL — ABNORMAL LOW (ref 8.9–10.3)
Chloride: 110 mmol/L (ref 98–111)
Creatinine, Ser: 0.73 mg/dL (ref 0.44–1.00)
GFR, Estimated: >60 mL/min (ref >60)
Glucose, Bld: 105 mg/dL — ABNORMAL HIGH (ref 70–99)
Potassium: 3.6 mmol/L (ref 3.5–5.1)
Sodium: 136 mmol/L (ref 135–145)
Total Bilirubin: 0.4 mg/dL (ref 0.3–1.2)
Total Protein: 5.2 g/dL — ABNORMAL LOW (ref 6.5–8.1)

## 2020-12-17 LAB — CBC
HCT: 34.1 % — ABNORMAL LOW (ref 36.0–46.0)
Hemoglobin: 10.9 g/dL — ABNORMAL LOW (ref 12.0–15.0)
MCH: 28.8 pg (ref 26.0–34.0)
MCHC: 32 g/dL (ref 30.0–36.0)
MCV: 90.2 fL (ref 80.0–100.0)
Platelets: 201 10*3/uL (ref 150–400)
RBC: 3.78 MIL/uL — ABNORMAL LOW (ref 3.87–5.11)
RDW: 13.1 % (ref 11.5–15.5)
WBC: 11.5 10*3/uL — ABNORMAL HIGH (ref 4.0–10.5)
nRBC: 0 % (ref 0.0–0.2)

## 2020-12-17 LAB — TYPE AND SCREEN
ABO/RH(D): O POS
Antibody Screen: NEGATIVE

## 2020-12-17 LAB — RESP PANEL BY RT-PCR (FLU A&B, COVID) ARPGX2
Influenza A by PCR: NEGATIVE
Influenza B by PCR: NEGATIVE
SARS Coronavirus 2 by RT PCR: NEGATIVE

## 2020-12-17 LAB — GROUP B STREP BY PCR: Group B strep by PCR: NEGATIVE

## 2020-12-17 MED ORDER — ACETAMINOPHEN 325 MG PO TABS
650.0000 mg | ORAL_TABLET | ORAL | Status: DC | PRN
  Administered 2020-12-18 – 2020-12-19 (×3): 650 mg via ORAL
  Filled 2020-12-17 (×3): qty 2

## 2020-12-17 MED ORDER — OXYTOCIN BOLUS FROM INFUSION
333.0000 mL | Freq: Once | INTRAVENOUS | Status: AC
  Administered 2020-12-19: 15:00:00 333 mL via INTRAVENOUS

## 2020-12-17 MED ORDER — SOD CITRATE-CITRIC ACID 500-334 MG/5ML PO SOLN: 30.0000 mL | ORAL | Status: DC | PRN

## 2020-12-17 MED ORDER — TERBUTALINE SULFATE 1 MG/ML IJ SOLN: 0.2500 mg | Freq: Once | INTRAMUSCULAR | Status: DC | PRN

## 2020-12-17 MED ORDER — ONDANSETRON HCL 4 MG/2ML IJ SOLN
4.0000 mg | Freq: Four times a day (QID) | INTRAMUSCULAR | Status: DC | PRN
  Administered 2020-12-19: 12:00:00 4 mg via INTRAVENOUS
  Filled 2020-12-17: qty 2

## 2020-12-17 MED ORDER — OXYCODONE-ACETAMINOPHEN 5-325 MG PO TABS: 2.0000 | ORAL_TABLET | ORAL | Status: DC | PRN

## 2020-12-17 MED ORDER — MISOPROSTOL 25 MCG QUARTER TABLET
25.0000 ug | ORAL_TABLET | ORAL | Status: DC | PRN
  Administered 2020-12-17 – 2020-12-18 (×4): 25 ug via VAGINAL
  Filled 2020-12-17 (×4): qty 1

## 2020-12-17 MED ORDER — LACTATED RINGERS IV SOLN: INTRAVENOUS | Status: DC

## 2020-12-17 MED ORDER — OXYCODONE-ACETAMINOPHEN 5-325 MG PO TABS: 1.0000 | ORAL_TABLET | ORAL | Status: DC | PRN

## 2020-12-17 MED ORDER — LACTATED RINGERS IV SOLN: 500.0000 mL | INTRAVENOUS | Status: DC | PRN

## 2020-12-17 MED ORDER — OXYTOCIN-SODIUM CHLORIDE 30-0.9 UT/500ML-% IV SOLN: 2.5000 [IU]/h | INTRAVENOUS | Status: DC

## 2020-12-17 MED ORDER — LIDOCAINE HCL (PF) 1 % IJ SOLN: 30.0000 mL | INTRAMUSCULAR | Status: DC | PRN

## 2020-12-17 MED ORDER — FLEET ENEMA 7-19 GM/118ML RE ENEM: 1.0000 | ENEMA | RECTAL | Status: DC | PRN

## 2020-12-17 NOTE — H&P (Signed)
°  Jill Willis is a 35 y.o. female G1P0 [redacted]w[redacted]d presenting for IOL for gestational HTN. She reports no LOF, VB, Contractions. Normal FM. No headache. Has had intermittent RUQ pain for the past few weeks, LFTs have been normal. No vision changes.    Pregnancy c/b: IVF pregnancy Marginal cord insertion: last growth at 30 weeks, EFW: vtx 1667 gm (3#11) 55% Hyperthyroid: not on meds  OB History     Gravida  1   Para      Term      Preterm      AB      Living         SAB      IAB      Ectopic      Multiple      Live Births             Past Medical History:  Diagnosis Date   Chickenpox    Sinusitis    Sore throat    Past Surgical History:  Procedure Laterality Date   egg retrieval     TONSILLECTOMY     Family History: family history includes Diabetes in her paternal grandfather; Hypothyroidism in her mother. Social History:  reports that she has never smoked. She has never used smokeless tobacco. She reports that she does not currently use alcohol. She reports that she does not use drugs.     Maternal Diabetes: No Genetic Screening: Declined Maternal Ultrasounds/Referrals: Normal Fetal Ultrasounds or other Referrals:  None Maternal Substance Abuse:  No Significant Maternal Medications:  None Significant Maternal Lab Results:  None Other Comments:  None  Review of Systems Per HPI Exam Physical Exam    Blood pressure 132/87, pulse 93, resp. rate 16, height 5\' 2"  (1.575 m), weight 85.8 kg, SpO2 98 %. Gen: NAD, resting comfortably CVS: Normal pulses Lungs: nonlabored respirations Abd: Gravid abdomen Fetal testing: 135bpm, mod variability, + accels, no decels Toco: acontractile Prenatal labs: ABO, Rh:  O/Positive/-- (06/28 0000) Antibody: Negative (06/28 0000) Rubella: Immune (06/28 0000) RPR: Nonreactive (10/13 0000)  HBsAg: Negative (06/28 0000)  HIV: Non-reactive (06/28 0000)  GBS:     Assessment/Plan: 35Y G1P0 @ [redacted]w[redacted]d, IOL  gestational hypertension Fetal wellbeing: cat I tracing IOL: cytotec PRN cervical ripening GBS collected yesterday, no result, will order stat GBS PCR Pain control: epidural upon patient request Gestational HTN: labs pending, monitor for signs/symptoms of preeclampsia   [redacted]w[redacted]d 12/17/2020, 12:03 PM

## 2020-12-18 ENCOUNTER — Inpatient Hospital Stay (HOSPITAL_COMMUNITY): Payer: BC Managed Care – PPO | Admitting: Anesthesiology

## 2020-12-18 LAB — CBC
HCT: 32.6 % — ABNORMAL LOW (ref 36.0–46.0)
Hemoglobin: 10.6 g/dL — ABNORMAL LOW (ref 12.0–15.0)
MCH: 29.1 pg (ref 26.0–34.0)
MCHC: 32.5 g/dL (ref 30.0–36.0)
MCV: 89.6 fL (ref 80.0–100.0)
Platelets: 172 10*3/uL (ref 150–400)
RBC: 3.64 MIL/uL — ABNORMAL LOW (ref 3.87–5.11)
RDW: 13.1 % (ref 11.5–15.5)
WBC: 13 10*3/uL — ABNORMAL HIGH (ref 4.0–10.5)
nRBC: 0 % (ref 0.0–0.2)

## 2020-12-18 LAB — RPR: RPR Ser Ql: NONREACTIVE

## 2020-12-18 MED ORDER — OXYTOCIN-SODIUM CHLORIDE 30-0.9 UT/500ML-% IV SOLN
1.0000 m[IU]/min | INTRAVENOUS | Status: DC
  Administered 2020-12-18: 1 m[IU]/min via INTRAVENOUS
  Filled 2020-12-18: qty 500

## 2020-12-18 MED ORDER — BUTORPHANOL TARTRATE 1 MG/ML IJ SOLN: 1.0000 mg | INTRAMUSCULAR | Status: DC | PRN

## 2020-12-18 MED ORDER — OXYTOCIN-SODIUM CHLORIDE 30-0.9 UT/500ML-% IV SOLN
1.0000 m[IU]/min | INTRAVENOUS | Status: DC
  Administered 2020-12-19: 09:00:00 11 m[IU]/min via INTRAVENOUS
  Filled 2020-12-18: qty 500

## 2020-12-18 MED ORDER — DIPHENHYDRAMINE HCL 50 MG/ML IJ SOLN
50.0000 mg | Freq: Once | INTRAMUSCULAR | Status: AC
  Administered 2020-12-18: 50 mg via INTRAVENOUS
  Filled 2020-12-18: qty 1

## 2020-12-18 MED ORDER — EPHEDRINE 5 MG/ML INJ: 10.0000 mg | INTRAVENOUS | Status: DC | PRN

## 2020-12-18 MED ORDER — FENTANYL-BUPIVACAINE-NACL 0.5-0.125-0.9 MG/250ML-% EP SOLN
12.0000 mL/h | EPIDURAL | Status: DC | PRN
  Administered 2020-12-18 – 2020-12-19 (×2): 12 mL/h via EPIDURAL
  Filled 2020-12-18 (×2): qty 250

## 2020-12-18 MED ORDER — DIPHENHYDRAMINE HCL 50 MG/ML IJ SOLN: 12.5000 mg | INTRAMUSCULAR | Status: DC | PRN

## 2020-12-18 MED ORDER — PHENYLEPHRINE 40 MCG/ML (10ML) SYRINGE FOR IV PUSH (FOR BLOOD PRESSURE SUPPORT): 80.0000 ug | PREFILLED_SYRINGE | INTRAVENOUS | Status: DC | PRN

## 2020-12-18 MED ORDER — PHENYLEPHRINE 40 MCG/ML (10ML) SYRINGE FOR IV PUSH (FOR BLOOD PRESSURE SUPPORT)
80.0000 ug | PREFILLED_SYRINGE | INTRAVENOUS | Status: DC | PRN
  Filled 2020-12-18: qty 10

## 2020-12-18 MED ORDER — LIDOCAINE HCL (PF) 1 % IJ SOLN
INTRAMUSCULAR | Status: DC | PRN
  Administered 2020-12-18 (×2): 4 mL via EPIDURAL

## 2020-12-18 MED ORDER — LACTATED RINGERS IV SOLN
500.0000 mL | Freq: Once | INTRAVENOUS | Status: DC
  Administered 2020-12-19: 19:00:00 500 mL via INTRAVENOUS

## 2020-12-18 NOTE — Progress Notes (Signed)
Patient feeling some increased back pain BP 136/75    Pulse 77    Temp 98.3 F (36.8 C) (Oral)    Resp 17    Ht 5\' 2"  (1.575 m)    Wt 85.8 kg    SpO2 100%    BMI 34.59 kg/m  CE tight 2/50/-2, anterior. Clear ARoM @ 1400, pitocin at 12 mU/min [id number]  Continue to titrate pitocin, epidural upon request

## 2020-12-18 NOTE — Progress Notes (Signed)
OB Progress Note  S: Pt comfortable   O: Today's Vitals   12/18/20 0301 12/18/20 0331 12/18/20 0402 12/18/20 0431  BP: 138/87 (!) 129/96 119/76 125/61  Pulse: 73 82 64 68  Resp:      Temp:      TempSrc:      SpO2:      Weight:      Height:      PainSc:       Body mass index is 34.59 kg/m.  SVE 1/20/-3/firm/posterior  FHR: 125bpm, mod variability, + accels, no decels Toco: quiet   A/P: 35Y G1P0 @ [redacted]w[redacted]d, IOL GHTN 1. Fetal wellbeing: Cat I tracing 2. IOL: minimal effect after cytotec x 4. I attempted to place foley and Adriana Simas without success due to posterior cervix. Discussed with patient. She would like to try something else. Will start low dose pitocin.  3. GHTN: Bps normal to mild range, admission labs within normal limits 4. Pain control: epidural upon patient request  M. Timothy Lasso, MD 12/18/20 5:13 AM

## 2020-12-18 NOTE — Progress Notes (Signed)
Patient comfortable with epidural, no other complaints, denies PreE symptoms BP 128/80    Pulse 66    Temp 98.2 F (36.8 C) (Oral)    Resp 16    Ht 5\' 2"  (1.575 m)    Wt 85.8 kg    SpO2 100%    BMI 34.59 kg/m  CE 2/90/-1, moderate return of clear fluid TOCO q3-85m, pitocin @ 20 mU/min  IUPC placed, category 1 tracing, continue to tirtate until adequate MVUs

## 2020-12-18 NOTE — Progress Notes (Signed)
Patient feeling some increased back pain, denies PreE symptoms  BP 140/86    Pulse 76    Temp 98.2 F (36.8 C) (Oral)    Resp 18    Ht 5\' 2"  (1.575 m)    Wt 85.8 kg    SpO2 100%    BMI 34.59 kg/m  CE 1/50/-2, still significant angle between internal and external os Cat 1 tracing, irritability on TOCO, pitocin at 35mU/min  Continue titrating low-dose pitocin. Attempted FB placement at this time - which patient tolerated well, but was unable to pass FB through internal os. Given recent change in exam, will continue pitocin at this time. Patient requesting IV meds at this time

## 2020-12-18 NOTE — Anesthesia Procedure Notes (Signed)
Epidural Patient location during procedure: OB Start time: 12/18/2020 5:10 PM End time: 12/18/2020 5:13 PM  Staffing Anesthesiologist: Kaylyn Layer, MD Performed: anesthesiologist   Preanesthetic Checklist Completed: patient identified, IV checked, risks and benefits discussed, monitors and equipment checked, pre-op evaluation and timeout performed  Epidural Patient position: sitting Prep: DuraPrep and site prepped and draped Patient monitoring: continuous pulse ox, blood pressure and heart rate Approach: midline Location: L3-L4 Injection technique: LOR air  Needle:  Needle type: Tuohy  Needle gauge: 17 G Needle length: 9 cm Needle insertion depth: 5 cm Catheter type: closed end flexible Catheter size: 19 Gauge Catheter at skin depth: 10 cm Test dose: negative and Other (1% lidocaine)  Assessment Events: blood not aspirated, injection not painful, no injection resistance, no paresthesia and negative IV test  Additional Notes Patient identified. Risks, benefits, and alternatives discussed with patient including but not limited to bleeding, infection, nerve damage, paralysis, failed block, incomplete pain control, headache, blood pressure changes, nausea, vomiting, reactions to medication, itching, and postpartum back pain. Confirmed with bedside nurse the patient's most recent platelet count. Confirmed with patient that they are not currently taking any anticoagulation, have any bleeding history, or any family history of bleeding disorders. Patient expressed understanding and wished to proceed. All questions were answered. Sterile technique was used throughout the entire procedure. Please see nursing notes for vital signs.   Crisp LOR on first pass. Test dose was given through epidural catheter and negative prior to continuing to dose epidural or start infusion. Warning signs of high block given to the patient including shortness of breath, tingling/numbness in hands, complete  motor block, or any concerning symptoms with instructions to call for help. Patient was given instructions on fall risk and not to get out of bed. All questions and concerns addressed with instructions to call with any issues or inadequate analgesia.  Reason for block:procedure for pain

## 2020-12-18 NOTE — Anesthesia Preprocedure Evaluation (Signed)
Anesthesia Evaluation  Patient identified by MRN, date of birth, ID band Patient awake    Reviewed: Allergy & Precautions, Patient's Chart, lab work & pertinent test results  History of Anesthesia Complications Negative for: history of anesthetic complications  Airway Mallampati: II  TM Distance: >3 FB Neck ROM: Full    Dental no notable dental hx.    Pulmonary neg pulmonary ROS,    Pulmonary exam normal        Cardiovascular negative cardio ROS Normal cardiovascular exam     Neuro/Psych negative neurological ROS  negative psych ROS   GI/Hepatic negative GI ROS, Neg liver ROS,   Endo/Other  negative endocrine ROS  Renal/GU negative Renal ROS  negative genitourinary   Musculoskeletal negative musculoskeletal ROS (+)   Abdominal   Peds  Hematology negative hematology ROS (+)   Anesthesia Other Findings Day of surgery medications reviewed with patient.  Reproductive/Obstetrics (+) Pregnancy (preE)                             Anesthesia Physical Anesthesia Plan  ASA: 3  Anesthesia Plan: Epidural   Post-op Pain Management:    Induction:   PONV Risk Score and Plan: Treatment may vary due to age or medical condition  Airway Management Planned: Natural Airway  Additional Equipment: Fetal Monitoring  Intra-op Plan:   Post-operative Plan:   Informed Consent: I have reviewed the patients History and Physical, chart, labs and discussed the procedure including the risks, benefits and alternatives for the proposed anesthesia with the patient or authorized representative who has indicated his/her understanding and acceptance.       Plan Discussed with:   Anesthesia Plan Comments:         Anesthesia Quick Evaluation  

## 2020-12-19 ENCOUNTER — Encounter (HOSPITAL_COMMUNITY): Payer: Self-pay | Admitting: Obstetrics and Gynecology

## 2020-12-19 LAB — CBC
HCT: 29.6 % — ABNORMAL LOW (ref 36.0–46.0)
Hemoglobin: 9.7 g/dL — ABNORMAL LOW (ref 12.0–15.0)
MCH: 29.2 pg (ref 26.0–34.0)
MCHC: 32.8 g/dL (ref 30.0–36.0)
MCV: 89.2 fL (ref 80.0–100.0)
Platelets: 200 10*3/uL (ref 150–400)
RBC: 3.32 MIL/uL — ABNORMAL LOW (ref 3.87–5.11)
RDW: 13.3 % (ref 11.5–15.5)
WBC: 22.7 10*3/uL — ABNORMAL HIGH (ref 4.0–10.5)
nRBC: 0 % (ref 0.0–0.2)

## 2020-12-19 MED ORDER — GENTAMICIN SULFATE 40 MG/ML IJ SOLN: 5.0000 mg/kg | INTRAVENOUS | Status: DC

## 2020-12-19 MED ORDER — FENTANYL CITRATE (PF) 100 MCG/2ML IJ SOLN
INTRAMUSCULAR | Status: AC
  Filled 2020-12-19: qty 2

## 2020-12-19 MED ORDER — LACTATED RINGERS AMNIOINFUSION: INTRAVENOUS | Status: DC

## 2020-12-19 MED ORDER — TRANEXAMIC ACID-NACL 1000-0.7 MG/100ML-% IV SOLN
1000.0000 mg | Freq: Once | INTRAVENOUS | Status: AC
  Administered 2020-12-19: 15:00:00 1000 mg via INTRAVENOUS

## 2020-12-19 MED ORDER — GENTAMICIN SULFATE 40 MG/ML IJ SOLN
5.0000 mg/kg | INTRAVENOUS | Status: DC
  Administered 2020-12-19: 13:00:00 320 mg via INTRAVENOUS
  Filled 2020-12-19: qty 8

## 2020-12-19 MED ORDER — ZOLPIDEM TARTRATE 5 MG PO TABS: 5.0000 mg | ORAL_TABLET | Freq: Every evening | ORAL | Status: DC | PRN

## 2020-12-19 MED ORDER — COCONUT OIL OIL: 1.0000 "application " | TOPICAL_OIL | Status: DC | PRN

## 2020-12-19 MED ORDER — FENTANYL CITRATE (PF) 100 MCG/2ML IJ SOLN: INTRAMUSCULAR | Status: DC | PRN

## 2020-12-19 MED ORDER — TRANEXAMIC ACID-NACL 1000-0.7 MG/100ML-% IV SOLN
INTRAVENOUS | Status: AC
  Filled 2020-12-19: qty 100

## 2020-12-19 MED ORDER — SODIUM CHLORIDE 0.9 % IV SOLN
2.0000 g | Freq: Four times a day (QID) | INTRAVENOUS | Status: DC
  Administered 2020-12-19: 18:00:00 2 g via INTRAVENOUS
  Filled 2020-12-19: qty 2000

## 2020-12-19 MED ORDER — PRENATAL MULTIVITAMIN CH
1.0000 | ORAL_TABLET | Freq: Every day | ORAL | Status: DC
  Administered 2020-12-20 – 2020-12-21 (×2): 1 via ORAL
  Filled 2020-12-19 (×2): qty 1

## 2020-12-19 MED ORDER — SODIUM CHLORIDE 0.9 % IV SOLN
2.0000 g | Freq: Once | INTRAVENOUS | Status: AC
  Administered 2020-12-19: 13:00:00 2 g via INTRAVENOUS
  Filled 2020-12-19: qty 2000

## 2020-12-19 MED ORDER — BUPIVACAINE HCL (PF) 0.25 % IJ SOLN
INTRAMUSCULAR | Status: DC | PRN
  Administered 2020-12-19 (×2): 8 mL via EPIDURAL

## 2020-12-19 MED ORDER — WITCH HAZEL-GLYCERIN EX PADS: 1.0000 "application " | MEDICATED_PAD | CUTANEOUS | Status: DC | PRN

## 2020-12-19 MED ORDER — ACETAMINOPHEN 325 MG PO TABS: 650.0000 mg | ORAL_TABLET | ORAL | Status: DC | PRN

## 2020-12-19 MED ORDER — SODIUM CHLORIDE 0.9 % IV SOLN
500.0000 mg | INTRAVENOUS | Status: DC
  Administered 2020-12-19: 05:00:00 500 mg via INTRAVENOUS
  Filled 2020-12-19 (×3): qty 5

## 2020-12-19 MED ORDER — SIMETHICONE 80 MG PO CHEW: 80.0000 mg | CHEWABLE_TABLET | ORAL | Status: DC | PRN

## 2020-12-19 MED ORDER — FENTANYL CITRATE (PF) 100 MCG/2ML IJ SOLN
INTRAMUSCULAR | Status: DC | PRN
  Administered 2020-12-19: 100 ug via EPIDURAL

## 2020-12-19 MED ORDER — SENNOSIDES-DOCUSATE SODIUM 8.6-50 MG PO TABS
2.0000 | ORAL_TABLET | Freq: Every day | ORAL | Status: DC
  Administered 2020-12-20 – 2020-12-21 (×2): 2 via ORAL
  Filled 2020-12-19 (×2): qty 2

## 2020-12-19 MED ORDER — DIBUCAINE (PERIANAL) 1 % EX OINT: 1.0000 "application " | TOPICAL_OINTMENT | CUTANEOUS | Status: DC | PRN

## 2020-12-19 MED ORDER — DIPHENHYDRAMINE HCL 25 MG PO CAPS: 25.0000 mg | ORAL_CAPSULE | Freq: Four times a day (QID) | ORAL | Status: DC | PRN

## 2020-12-19 MED ORDER — FENTANYL CITRATE (PF) 100 MCG/2ML IJ SOLN
INTRAMUSCULAR | Status: DC | PRN
  Administered 2020-12-19: 04:00:00 100 ug via EPIDURAL

## 2020-12-19 MED ORDER — ONDANSETRON HCL 4 MG PO TABS: 4.0000 mg | ORAL_TABLET | ORAL | Status: DC | PRN

## 2020-12-19 MED ORDER — IBUPROFEN 600 MG PO TABS
600.0000 mg | ORAL_TABLET | Freq: Four times a day (QID) | ORAL | Status: DC
  Administered 2020-12-19 – 2020-12-21 (×8): 600 mg via ORAL
  Filled 2020-12-19 (×8): qty 1

## 2020-12-19 MED ORDER — TETANUS-DIPHTH-ACELL PERTUSSIS 5-2.5-18.5 LF-MCG/0.5 IM SUSY: 0.5000 mL | PREFILLED_SYRINGE | Freq: Once | INTRAMUSCULAR | Status: DC

## 2020-12-19 MED ORDER — BENZOCAINE-MENTHOL 20-0.5 % EX AERO
1.0000 "application " | INHALATION_SPRAY | CUTANEOUS | Status: DC | PRN
  Administered 2020-12-19: 1 via TOPICAL
  Filled 2020-12-19: qty 56

## 2020-12-19 MED ORDER — ONDANSETRON HCL 4 MG/2ML IJ SOLN: 4.0000 mg | INTRAMUSCULAR | Status: DC | PRN

## 2020-12-19 NOTE — Progress Notes (Signed)
Patient with fetal tachycardia and maternal temp 102F, triple I criteria met. Amp/Gent ordered BP 112/83    Pulse (!) 118    Temp (!) 103 F (39.4 C) (Oral)    Resp 16    Ht _0  (1.575 m)    Wt 85.8 kg    SpO2 100%    BMI 34.59 kg/m

## 2020-12-19 NOTE — Progress Notes (Signed)
Left hip pain improving with button, otherwise feeling well, denies PreE symptoms  BP (!) 107/56    Pulse 72    Temp 99.3 F (37.4 C) (Axillary)    Resp 17    Ht 5\' 2"  (1.575 m)    Wt 85.8 kg    SpO2 100%    BMI 34.59 kg/m  CE 6/90/0, Category 1-2, min to mod var, occ variables with early decels, +fetal scalp stim TOCO q 4-26m, pitocin at 27mU/min  Now in active phase labor, continue to monitor

## 2020-12-19 NOTE — Progress Notes (Signed)
Comfortable on epidural, denies PreE symptoms  Pitocin turned down to 10 mU/min at 0230 due to subtle recurrent lates which have improved. Variables ntoed at same time, amnio in place BP 121/62    Pulse 73    Temp 98.6 F (37 C) (Oral)    Resp 17    Ht 5\' 2"  (1.575 m)    Wt 85.8 kg    SpO2 100%    BMI 34.59 kg/m  CE 3-4/90/-1, pitocin at 10 mU/min TOCO q8-57m, mix of early and var decels  Continue amnioinfusion at this time, Cat 1 -2 tracing, titrate pitocin up as tolerated per protocol. IV azithromycin given length of ROM

## 2020-12-19 NOTE — Progress Notes (Signed)
In to meet pt at shift change at 1913. Ampicillin finished and IV saline locked. Pt c/o new onset tingling to nose and fingers in both hands. VSS, pt in no apparent distress. Dr. Reina Fuse called and ampicillin d/c'd.

## 2020-12-19 NOTE — Progress Notes (Signed)
Pharmacy Antibiotic Note  Jill Willis is a 35 y.o. female admitted on 12/17/2020 with IOL at 37w due to gestational hypertension.  Pharmacy has been consulted for Gentamicin dosing for Triple I  Plan: Gentamicin 5mg /kg (320mg ) IV q24h Will continue to follow.   Height: 5\' 2"  (157.5 cm) Weight: 85.8 kg (189 lb 1.6 oz) IBW/kg (Calculated) : 50.1  Temp (24hrs), Avg:99.1 F (37.3 C), Min:97.9 F (36.6 C), Max:103 F (39.4 C)  Recent Labs  Lab 12/16/20 1714 12/17/20 1151 12/18/20 1048  WBC 12.3* 11.5* 13.0*  CREATININE 0.76 0.73  --     Estimated Creatinine Clearance: 101.7 mL/min (by C-G formula based on SCr of 0.73 mg/dL).    No Known Allergies  Antimicrobials this admission: Ampicillin 2 gram IV x 1  12/18    Thank you for allowing pharmacy to be a part of this patients care.  12/19/20 12/19/2020 12:33 PM

## 2020-12-20 LAB — CBC
HCT: 23.6 % — ABNORMAL LOW (ref 36.0–46.0)
HCT: 25.3 % — ABNORMAL LOW (ref 36.0–46.0)
Hemoglobin: 7.8 g/dL — ABNORMAL LOW (ref 12.0–15.0)
Hemoglobin: 8.3 g/dL — ABNORMAL LOW (ref 12.0–15.0)
MCH: 29.9 pg (ref 26.0–34.0)
MCH: 29.4 pg (ref 26.0–34.0)
MCHC: 33.1 g/dL (ref 30.0–36.0)
MCHC: 32.8 g/dL (ref 30.0–36.0)
MCV: 90.4 fL (ref 80.0–100.0)
MCV: 89.7 fL (ref 80.0–100.0)
Platelets: 169 10*3/uL (ref 150–400)
Platelets: 203 10*3/uL (ref 150–400)
RBC: 2.61 MIL/uL — ABNORMAL LOW (ref 3.87–5.11)
RBC: 2.82 MIL/uL — ABNORMAL LOW (ref 3.87–5.11)
RDW: 13.3 % (ref 11.5–15.5)
RDW: 13.3 % (ref 11.5–15.5)
WBC: 23.3 10*3/uL — ABNORMAL HIGH (ref 4.0–10.5)
WBC: 18.4 10*3/uL — ABNORMAL HIGH (ref 4.0–10.5)
nRBC: 0 % (ref 0.0–0.2)
nRBC: 0 % (ref 0.0–0.2)

## 2020-12-20 LAB — COMPREHENSIVE METABOLIC PANEL
ALT: 25 U/L (ref 0–44)
AST: 48 U/L — ABNORMAL HIGH (ref 15–41)
Albumin: 1.8 g/dL — ABNORMAL LOW (ref 3.5–5.0)
Alkaline Phosphatase: 101 U/L (ref 38–126)
Anion gap: 6 (ref 5–15)
BUN: 9 mg/dL (ref 6–20)
CO2: 21 mmol/L — ABNORMAL LOW (ref 22–32)
Calcium: 8.2 mg/dL — ABNORMAL LOW (ref 8.9–10.3)
Chloride: 110 mmol/L (ref 98–111)
Creatinine, Ser: 0.96 mg/dL (ref 0.44–1.00)
GFR, Estimated: >60 mL/min (ref >60)
Glucose, Bld: 74 mg/dL (ref 70–99)
Potassium: 4 mmol/L (ref 3.5–5.1)
Sodium: 137 mmol/L (ref 135–145)
Total Bilirubin: 0.4 mg/dL (ref 0.3–1.2)
Total Protein: 4.4 g/dL — ABNORMAL LOW (ref 6.5–8.1)

## 2020-12-20 LAB — PROTEIN / CREATININE RATIO, URINE
Creatinine, Urine: 60.37 mg/dL
Protein Creatinine Ratio: 0.25 mg/mg{Cre} — ABNORMAL HIGH (ref 0.00–0.15)
Total Protein, Urine: 15 mg/dL

## 2020-12-20 NOTE — Anesthesia Postprocedure Evaluation (Signed)
Anesthesia Post Note  Patient: Jill Willis  Procedure(s) Performed: AN AD HOC LABOR EPIDURAL     Patient location during evaluation: Mother Baby Anesthesia Type: Epidural Level of consciousness: awake and alert Pain management: pain level controlled Vital Signs Assessment: post-procedure vital signs reviewed and stable Respiratory status: spontaneous breathing, nonlabored ventilation and respiratory function stable Cardiovascular status: stable Postop Assessment: no headache, no backache and epidural receding Anesthetic complications: no   No notable events documented.  Last Vitals:  Vitals:   12/19/20 2213 12/20/20 0100  BP: 118/68 102/75  Pulse: 86 68  Resp: 20 18  Temp: 36.5 C (!) 36.4 C  SpO2: 99% 99%    Last Pain:  Vitals:   12/20/20 0100  TempSrc: Oral  PainSc: 0-No pain   Pain Goal:                   [name]

## 2020-12-20 NOTE — Addendum Note (Signed)
Addendum  created 12/20/20 0802 by Renford Dills, CRNA   Clinical Note Signed

## 2020-12-20 NOTE — Anesthesia Postprocedure Evaluation (Signed)
Anesthesia Post Note  Patient: Jill Willis  Procedure(s) Performed: AN AD HOC LABOR EPIDURAL     Patient location during evaluation: Mother Baby Anesthesia Type: Epidural Level of consciousness: awake and alert Pain management: pain level controlled Vital Signs Assessment: post-procedure vital signs reviewed and stable Respiratory status: spontaneous breathing, nonlabored ventilation and respiratory function stable Cardiovascular status: stable Postop Assessment: no headache, no backache and epidural receding Anesthetic complications: no   No notable events documented.  Last Vitals:  Vitals:   12/20/20 0505 12/20/20 0632  BP: 110/80   Pulse: 63   Resp: 20   Temp:  (!) 36.4 C  SpO2: 98%     Last Pain:  Vitals:   12/20/20 0632  TempSrc: Oral  PainSc:    Pain Goal:                   [name]

## 2020-12-20 NOTE — Progress Notes (Signed)
Notified Dr. Mindi Slicker of elevated BP this afternoon. Dr. Mindi Slicker ordered labs and to recheck BP in one hour. Earl Gala, Linda Hedges Hardin

## 2020-12-20 NOTE — Progress Notes (Signed)
Post Partum Day 1 Subjective: no complaints, up ad lib, voiding, tolerating PO, + flatus, and lochia mild. Pain well controlled. She is bonding well with baby - breastfeeding. Peds recommends baby stay till tomorrow.   Objective: Blood pressure 110/80, pulse 63, temperature (!) 97.5 F (36.4 C), temperature source Oral, resp. rate 20, height 5\' 2"  (1.575 m), weight 85.8 kg, SpO2 98 %, unknown if currently breastfeeding.  Physical Exam:  General: alert, cooperative, and no distress Lochia: appropriate Uterine Fundus: firm Incision: n/a DVT Evaluation: No evidence of DVT seen on physical exam. Calf/Ankle edema is present.  Recent Labs    12/19/20 1521 12/20/20 0458  HGB 9.7* 7.8*  HCT 29.6* 23.6*    Assessment/Plan: Plan for discharge tomorrow, Breastfeeding, and Circumcision prior to discharge Routine pp care   LOS: 3 days   Jhade Berko W Ladarryl Wrage 12/20/2020, 11:23 AM

## 2020-12-21 LAB — SURGICAL PATHOLOGY

## 2020-12-21 NOTE — Discharge Summary (Signed)
Postpartum Discharge Summary    Patient Name: Jill Willis DOB: December 05, 1985 MRN: [id number]  Date of admission: 12/17/2020 Delivery date:12/19/2020  Delivering provider: Deliah Boston  Date of discharge: 12/21/2020  Admitting diagnosis: Preeclampsia [O14.90] Intrauterine pregnancy: [redacted]w[redacted]d    Secondary diagnosis:  Principal Problem:   Preeclampsia  Additional problems: IVF, hyperthyroidism no meds    Discharge diagnosis: Term Pregnancy Delivered                                              Post partum procedures: none Augmentation: AROM, Pitocin, Cytotec, and IP Foley Complications: None  Hospital course: Induction of Labor With Vaginal Delivery   35y.o. yo G1P1001 at 345w2das admitted to the hospital 12/17/2020 for induction of labor.  Indication for induction: Gestational hypertension.  Patient had an uncomplicated labor course as follows: Membrane Rupture Time/Date: 2:22 PM ,12/18/2020   Delivery Method:Vaginal, Vacuum (Extractor)  Episiotomy: Median  Lacerations:  2nd degree  Details of delivery can be found in separate delivery note.  Patient had a routine postpartum course. Patient is discharged home 12/21/20.  Newborn Data: Birth date:12/19/2020  Birth time:2:28 PM  Gender:Female  Living status:Living  Apgars:7 ,9  Weight:2866 g   Magnesium Sulfate received: No BMZ received: No Rhophylac:N/A MMR:N/A T-DaP: see office note Flu: N/A Transfusion:No  Physical exam  Vitals:   12/20/20 1638 12/20/20 1740 12/20/20 2025 12/21/20 0601  BP: (!) 132/92 128/75 121/75 124/74  Pulse:   83 75  Resp:   20 20  Temp:   (!) 97.5 F (36.4 C) 98.1 F (36.7 C)  TempSrc:   Oral Oral  SpO2:    100%  Weight:      Height:       General: alert, cooperative, and no distress Lochia: appropriate Uterine Fundus: firm Incision: N/A DVT Evaluation: No evidence of DVT seen on physical exam. Labs: Lab Results  Component Value Date   WBC 18.4 (H) 12/20/2020    HGB 8.3 (L) 12/20/2020   HCT 25.3 (L) 12/20/2020   MCV 89.7 12/20/2020   PLT 203 12/20/2020   CMP Latest Ref Rng & Units 12/20/2020  Glucose 70 - 99 mg/dL 74  BUN 6 - 20 mg/dL 9  Creatinine 0.44 - 1.00 mg/dL 0.96  Sodium 135 - 145 mmol/L 137  Potassium 3.5 - 5.1 mmol/L 4.0  Chloride 98 - 111 mmol/L 110  CO2 22 - 32 mmol/L 21(L)  Calcium 8.9 - 10.3 mg/dL 8.2(L)  Total Protein 6.5 - 8.1 g/dL 4.4(L)  Total Bilirubin 0.3 - 1.2 mg/dL 0.4  Alkaline Phos 38 - 126 U/L 101  AST 15 - 41 U/L 48(H)  ALT 0 - 44 U/L 25   Edinburgh Score: No flowsheet data found.    After visit meds:  Allergies as of 12/21/2020   No Known Allergies      Medication List     STOP taking these medications    calcium carbonate 500 MG chewable tablet Commonly known as: TUMS - dosed in mg elemental calcium   famotidine 20 MG tablet Commonly known as: PEPCID       TAKE these medications    acetaminophen 500 MG tablet Commonly known as: TYLENOL Take 1,000 mg by mouth every 6 (six) hours as needed for headache.   prenatal multivitamin Tabs tablet Take 1 tablet by mouth daily  at 12 noon.         Discharge home in stable condition Infant Feeding:  see peds note Infant Disposition:home with mother Discharge instruction: per After Visit Summary and Postpartum booklet. Activity: Advance as tolerated. Pelvic rest for 6 weeks.  Diet: routine diet Anticipated Birth Control: Unsure Postpartum Appointment:1 week Additional Postpartum F/U: Postpartum Depression checkup and BP check 1 week Future Appointments:No future appointments. Follow up Visit:  Follow-up Information     Ob/Gyn, [facility]. Schedule an appointment as soon as possible for a visit.   Why: Schedule BP check within 1 week Contact information: [address] [place] [address] [phone]                     12/21/2020 Allyn Kenner, DO

## 2020-12-30 ENCOUNTER — Telehealth (HOSPITAL_COMMUNITY): Payer: Self-pay | Admitting: *Deleted

## 2020-12-30 NOTE — Telephone Encounter (Signed)
Attempted Hospital Discharge Follow-Up Call.  Left voice mail requesting that patient return RN's phone call.  

## 2022-10-05 IMAGING — CT CT ABD-PELV W/ CM
2 of 4 series · 16 of 46 positions shown, 18 images · IV contrast (APPLIED)
Comparison: March 09, 2017

CLINICAL DATA: Abdominal pain and vaginal bleeding, status post
ovarian stimulation and egg retrieval procedure today.

EXAM:
CT ABDOMEN AND PELVIS WITH CONTRAST
TECHNIQUE: Multidetector CT imaging of the abdomen and pelvis was performed
using the standard protocol following bolus administration of
intravenous contrast.
CONTRAST:  100mL OMNIPAQUE IOHEXOL 300 MG/ML  SOLN

[Series 3: abdomen 5.0 · axial · 0.79mm/px · z∈[-452,-72]mm · 13 of 85 slices shown, 15 images]
[im 5/85  soft-tissue]
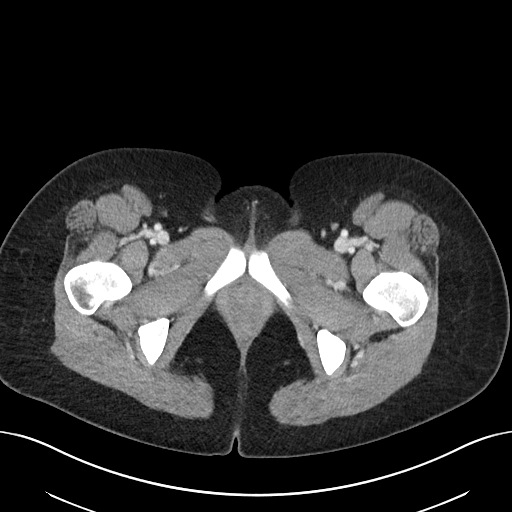
[im 5/85  bone]
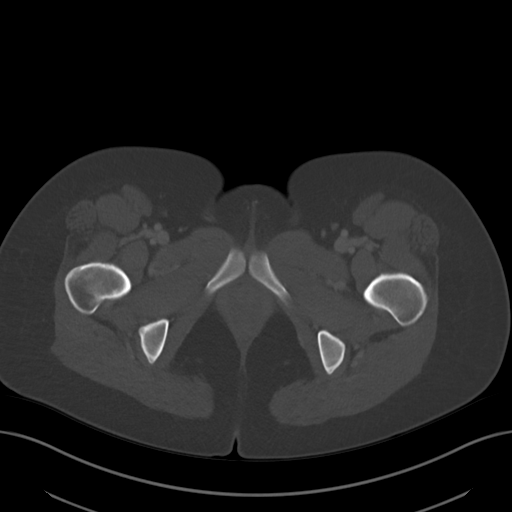
[im 13/85  soft-tissue]
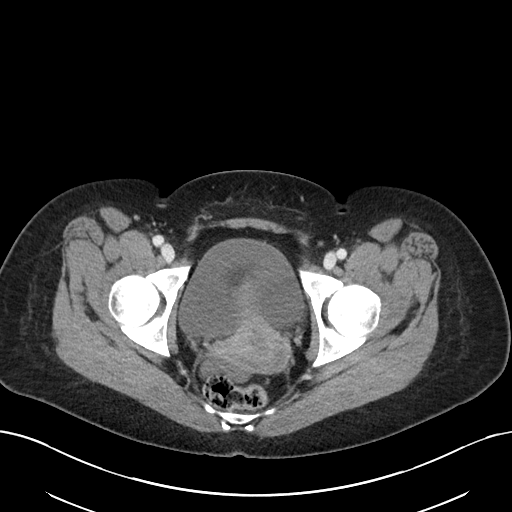
[im 17/85  soft-tissue]
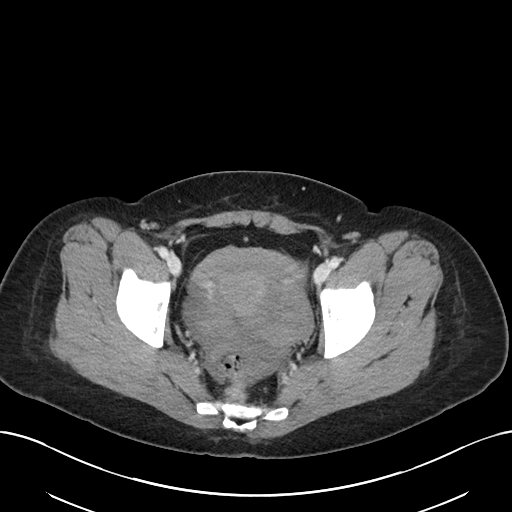
[im 25/85  soft-tissue]
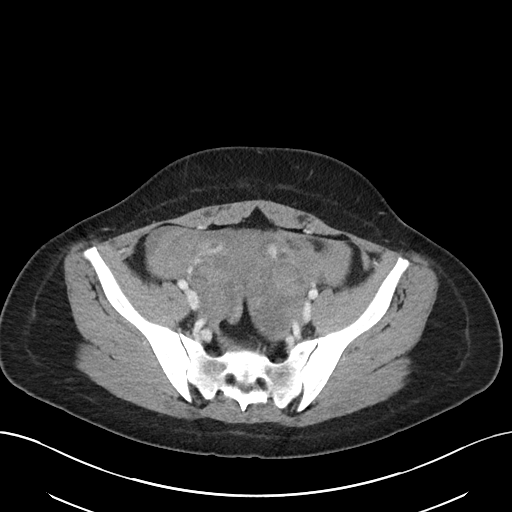
[im 29/85  soft-tissue]
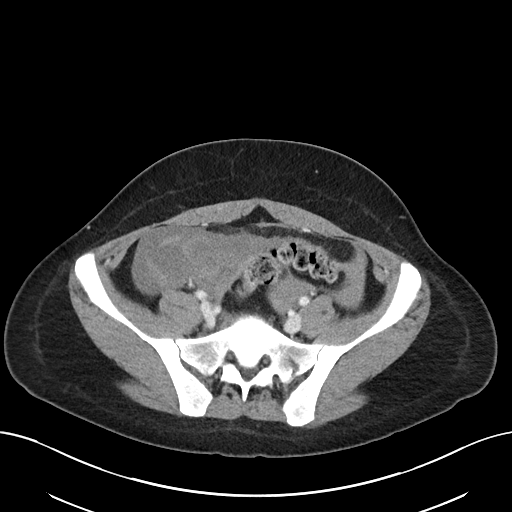
[im 37/85  soft-tissue]
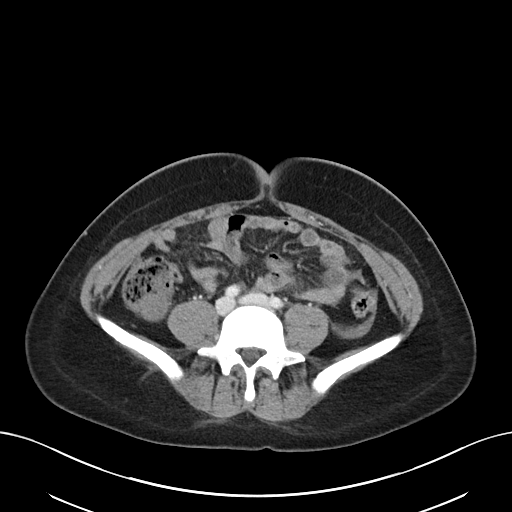
[im 45/85  soft-tissue]
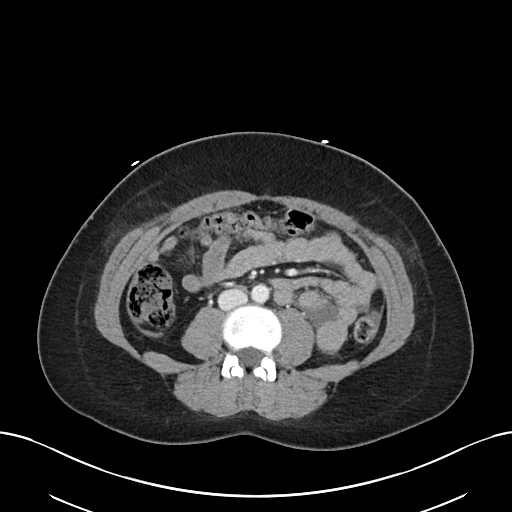
[im 49/85  soft-tissue]
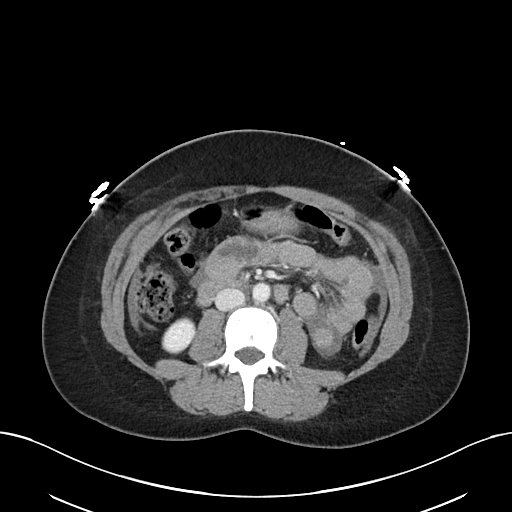
[im 57/85  soft-tissue]
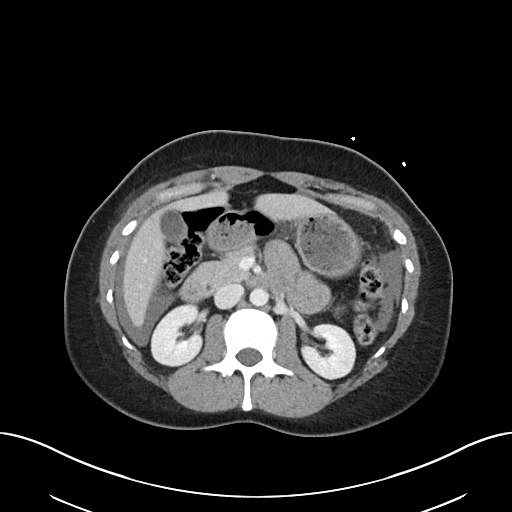
[im 57/85  bone]
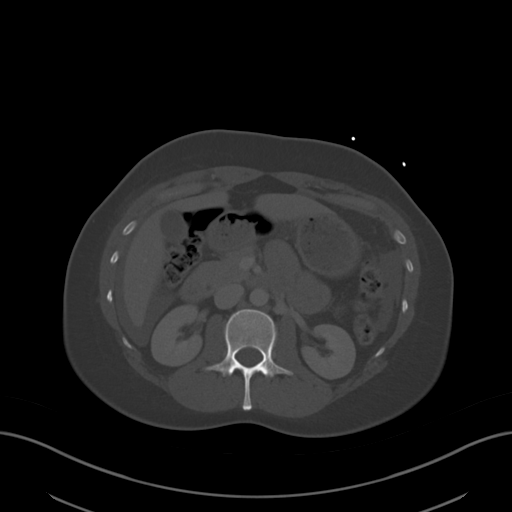
[im 61/85  soft-tissue]
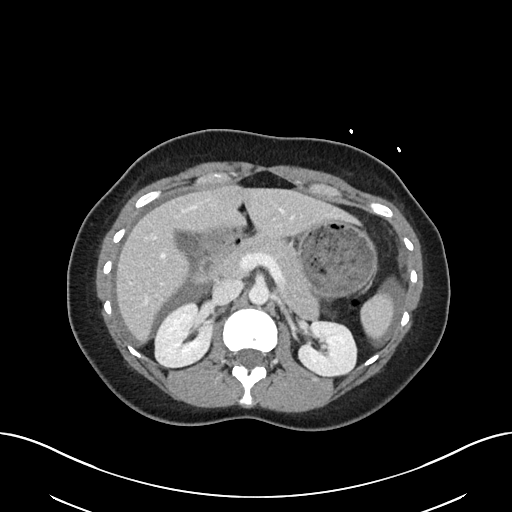
[im 69/85  soft-tissue]
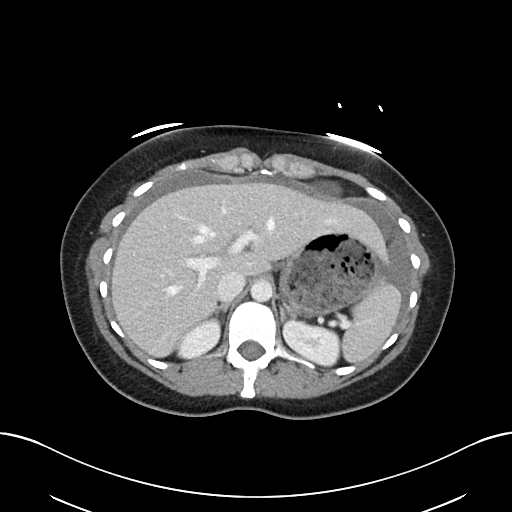
[im 73/85  soft-tissue]
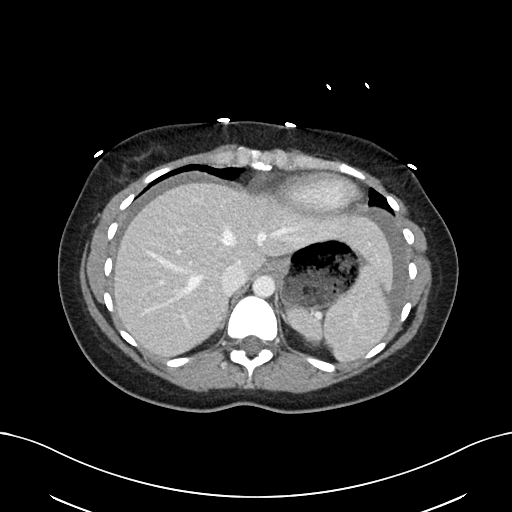
[im 81/85  soft-tissue]
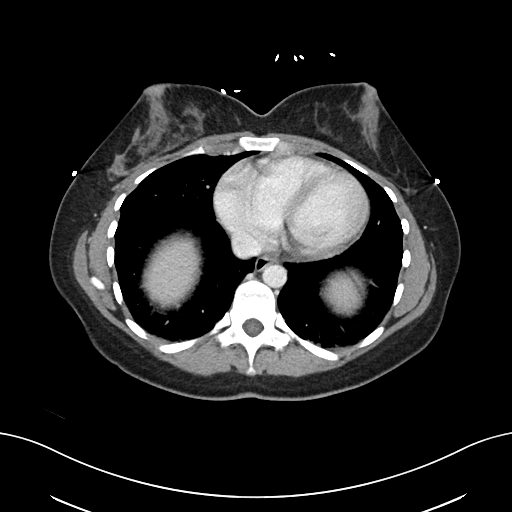

[Series 6: abdomen 3.0 mpr cor · coronal · 0.70mm/px · 3 of 102 slices shown]
[im 34/102  soft-tissue]
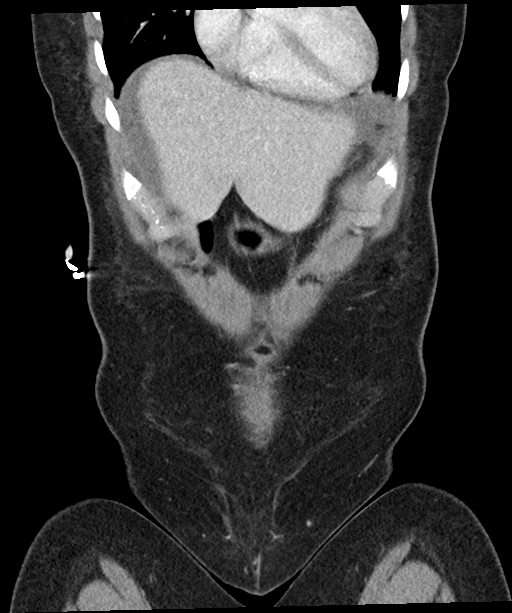
[im 45/102  soft-tissue]
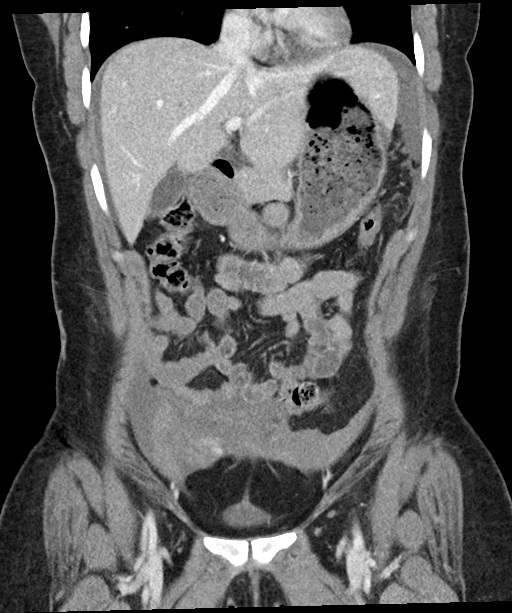
[im 57/102  soft-tissue]
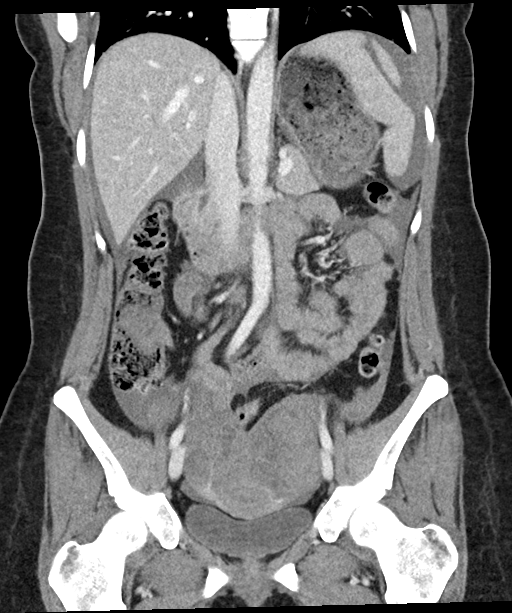

[16 of 46 positions shown; findings below may reference images not displayed]

FINDINGS: Lower chest: No acute abnormality.

Hepatobiliary: A 5 mm focus of parenchymal low attenuation is seen
at the junction of the right and left lobes of the liver. An
additional 6 mm focus of parenchymal low attenuation is seen within
the posterior aspect of the right lobe. No gallstones, gallbladder
wall thickening, or biliary dilatation.

Pancreas: Unremarkable. No pancreatic ductal dilatation or
surrounding inflammatory changes.

Spleen: Normal in size without focal abnormality.

Adrenals/Urinary Tract: Adrenal glands are unremarkable. Kidneys are
normal, without renal calculi, focal lesion, or hydronephrosis.
Bladder is unremarkable.

Stomach/Bowel: Stomach is within normal limits. Appendix appears
normal. No evidence of bowel wall thickening, distention, or
inflammatory changes.

Vascular/Lymphatic: No significant vascular findings are present. No
enlarged abdominal or pelvic lymph nodes.

Reproductive: The uterus is normal in size in appearance. A 6.1 cm x
5.1 cm x 6.4 cm area of heterogeneous attenuation is seen within the
left adnexa (axial CT images 58 through 71, CT series number 3). A
similar appearing poorly defined area of heterogeneous attenuation
is noted within the right adnexa. These areas are surrounded by
hemorrhagic free fluid. Tortuous, enhancing vessels are seen along
the anterior aspect of the bilateral adnexa. No areas of adnexal
contrast extravasation are identified.

Other: No abdominal wall hernia or abnormality.

A moderate amount of nonhemorrhagic free fluid is seen within the
mid and upper abdomen (approximately 41.28 Hounsfield units).

A mild to moderate amount of partially hemorrhagic free fluid is
seen within the pelvis (approximately 74.41 Hounsfield units).

Musculoskeletal: No acute or significant osseous findings.
IMPRESSION: 1. Findings consistent with bilateral adnexal hematomas, left larger
than right, with surrounding partially hemorrhagic pelvic free
fluid. This is likely secondary to the patient's recent ovarian
stimulation and a retrieval procedure.
2. Moderate amount of nonhemorrhagic free fluid within the upper
abdomen.

## 2023-01-03 NOTE — L&D Delivery Note (Signed)
 Delivery Note Patient pushed well for 15 minutes. At 11:10 PM a viable female was delivered via Vaginal, Spontaneous (Presentation: Left Occiput Anterior).  APGAR: 7, 9; weight 7 lb 5.8 oz (3340 gm) .   Placenta status: Spontaneous, Intact.  Cord: 3 vessels with the following complications: None.  Cord pH: n/a  Nuchal cord x 1 reduced prior to delivery.  Compound presentation with hand  Following delivery, prior to placenta delivery, there was rapid bleeding from the uterus (more than expected).  TXA was quickly administered and the placenta was delivered.  With placental delivery and fundal massage, bleeding quickly slowed  Anesthesia: Epidural Episiotomy: None Lacerations: 2nd degree;Vaginal Suture Repair: 2.0 vicryl rapide Est. Blood Loss (mL):  608 mL  Mom to postpartum.  Baby to Couplet care / Skin to Skin.  Jill Willis GEFFEL Jill Willis 11/14/2023, 11:29 PM

## 2023-05-10 DIAGNOSIS — O09529 Supervision of elderly multigravida, unspecified trimester: Secondary | ICD-10-CM | POA: Insufficient documentation

## 2023-05-10 DIAGNOSIS — Z8759 Personal history of other complications of pregnancy, childbirth and the puerperium: Secondary | ICD-10-CM | POA: Insufficient documentation

## 2023-05-10 DIAGNOSIS — E039 Hypothyroidism, unspecified: Secondary | ICD-10-CM | POA: Insufficient documentation

## 2023-05-10 DIAGNOSIS — Z8659 Personal history of other mental and behavioral disorders: Secondary | ICD-10-CM | POA: Insufficient documentation

## 2023-05-10 LAB — OB RESULTS CONSOLE GC/CHLAMYDIA
Chlamydia: NEGATIVE
Neisseria Gonorrhea: NEGATIVE

## 2023-05-10 LAB — OB RESULTS CONSOLE HEPATITIS B SURFACE ANTIGEN: Hepatitis B Surface Ag: NEGATIVE

## 2023-05-10 LAB — OB RESULTS CONSOLE HIV ANTIBODY (ROUTINE TESTING): HIV: NONREACTIVE

## 2023-05-10 LAB — OB RESULTS CONSOLE RUBELLA ANTIBODY, IGM: Rubella: IMMUNE

## 2023-05-10 LAB — OB RESULTS CONSOLE RPR: RPR: NONREACTIVE

## 2023-05-10 LAB — OB RESULTS CONSOLE ANTIBODY SCREEN: Antibody Screen: NEGATIVE

## 2023-05-10 LAB — HEPATITIS C ANTIBODY: HCV Ab: NEGATIVE

## 2023-05-22 ENCOUNTER — Telehealth: Payer: Self-pay

## 2023-05-22 ENCOUNTER — Other Ambulatory Visit: Payer: Self-pay | Admitting: Obstetrics and Gynecology

## 2023-05-22 ENCOUNTER — Encounter: Payer: Self-pay | Admitting: Obstetrics and Gynecology

## 2023-05-22 DIAGNOSIS — Z3183 Encounter for assisted reproductive fertility procedure cycle: Secondary | ICD-10-CM

## 2023-07-20 ENCOUNTER — Ambulatory Visit (HOSPITAL_BASED_OUTPATIENT_CLINIC_OR_DEPARTMENT_OTHER): Admitting: Obstetrics and Gynecology

## 2023-07-20 ENCOUNTER — Ambulatory Visit: Attending: Obstetrics and Gynecology

## 2023-07-20 ENCOUNTER — Other Ambulatory Visit: Payer: Self-pay | Admitting: *Deleted

## 2023-07-20 VITALS — BP 118/72 | HR 83

## 2023-07-20 DIAGNOSIS — O09819 Supervision of pregnancy resulting from assisted reproductive technology, unspecified trimester: Secondary | ICD-10-CM | POA: Insufficient documentation

## 2023-07-20 DIAGNOSIS — O09812 Supervision of pregnancy resulting from assisted reproductive technology, second trimester: Secondary | ICD-10-CM

## 2023-07-20 DIAGNOSIS — O09522 Supervision of elderly multigravida, second trimester: Secondary | ICD-10-CM | POA: Diagnosis present

## 2023-07-20 DIAGNOSIS — O99282 Endocrine, nutritional and metabolic diseases complicating pregnancy, second trimester: Secondary | ICD-10-CM

## 2023-07-20 DIAGNOSIS — E039 Hypothyroidism, unspecified: Secondary | ICD-10-CM | POA: Insufficient documentation

## 2023-07-20 DIAGNOSIS — Z3183 Encounter for assisted reproductive fertility procedure cycle: Secondary | ICD-10-CM | POA: Insufficient documentation

## 2023-07-20 DIAGNOSIS — Z3A20 20 weeks gestation of pregnancy: Secondary | ICD-10-CM | POA: Diagnosis present

## 2023-07-20 DIAGNOSIS — O35EXX0 Maternal care for other (suspected) fetal abnormality and damage, fetal genitourinary anomalies, not applicable or unspecified: Secondary | ICD-10-CM | POA: Diagnosis not present

## 2023-07-20 DIAGNOSIS — Z8759 Personal history of other complications of pregnancy, childbirth and the puerperium: Secondary | ICD-10-CM | POA: Diagnosis present

## 2023-07-20 DIAGNOSIS — E0590 Thyrotoxicosis, unspecified without thyrotoxic crisis or storm: Secondary | ICD-10-CM | POA: Insufficient documentation

## 2023-07-20 NOTE — Progress Notes (Signed)
 Maternal-Fetal Medicine Consultation Name: Jill Willis MRN: [id number]  G2 P1001 at 20w 2d gestation.  Patient is here for fetal anatomy scan. - Advanced maternal age.  On cell-free fetal DNA screening, the risks of fetal aneuploidies are not increased.  PGT showed no evidence of fetal aneuploidies. - IVF pregnancy and embryo transfer. - Hypothyroidism.  Diagnosed before first pregnancy(?).  Does not on antithyroid medications. - History of gestational hypertension. Obstetrical history significant for a term vaginal delivery in December 2022 of a female infant weighing 6 pounds and 5 ounces at birth.  Ultrasound We performed fetal anatomy scan.  Urinary tract dilation (UTD) is seen in the renal pelvis measures 5 mm.  Right renal pelvis appears normal.  Both kidneys, otherwise, appear normal with no increased echogenicities.  No other makers of aneuploidies or fetal structural defects are seen.  Fetal biometry is consistent with her previously-established dates. Amniotic fluid is normal and good fetal activity is seen.  Fetal heart rate and rhythm appear normal.  Patient understands the limitations of ultrasound in detecting fetal anomalies.   Our concerns include: Urinary tract dilation (UTD) I explained the finding with help of ultrasound images.  I reassured the patient that most UTD is resolved with advancing gestation and or only rarely associated with obstructive uropathy.  UTD is a marker for Down syndrome.  Given that she had low risk for Down syndrome on cell free fetal DNA screening, this should be considered normal variant. I discussed amniocentesis that will give a definitive result on the fetal karyotype.  I I explained the procedure and possible complication of miscarriage (1 and 500 procedures).  Patient opted not to have amniocentesis.  Hypothyroidism in pregnancy Patient is certain that she has a diagnosis of hyperthyroidism but is unsure whether she has Graves' disease. Poorly  controlled hyperthyroidism can lead to fetal growth restriction or preterm delivery.  Fetal/neonatal hyperthyroidism complicates 5% of pregnancies I recommend TRAb level estimation. TRAb is an increased in Graves' disease. I discussed ultrasound protocol of serial fetal growth assessments.  Since the patient had IVF, I recommend weekly antenatal testing from [redacted] weeks gestation until delivery. I recommended consultation with an endocrinologist.   Recommendations -An appointment was made for her to return in 4 weeks for completion of fetal anatomy. - Fetal growth assessments every 4 weeks until delivery. - Weekly antenatal testing from [redacted] weeks gestation until delivery.  -Thyroid  function tests and TRAb levels. -Consultation with endocrinologist.  Consultation including face-to-face (more than 50%) counseling 30 minutes.

## 2023-08-16 ENCOUNTER — Ambulatory Visit (HOSPITAL_BASED_OUTPATIENT_CLINIC_OR_DEPARTMENT_OTHER)

## 2023-08-16 ENCOUNTER — Ambulatory Visit: Attending: Obstetrics and Gynecology | Admitting: Maternal & Fetal Medicine

## 2023-08-16 ENCOUNTER — Other Ambulatory Visit: Payer: Self-pay | Admitting: *Deleted

## 2023-08-16 VITALS — BP 119/74 | HR 83

## 2023-08-16 DIAGNOSIS — O35EXX0 Maternal care for other (suspected) fetal abnormality and damage, fetal genitourinary anomalies, not applicable or unspecified: Secondary | ICD-10-CM

## 2023-08-16 DIAGNOSIS — O09522 Supervision of elderly multigravida, second trimester: Secondary | ICD-10-CM | POA: Diagnosis not present

## 2023-08-16 DIAGNOSIS — Z8759 Personal history of other complications of pregnancy, childbirth and the puerperium: Secondary | ICD-10-CM

## 2023-08-16 DIAGNOSIS — E0590 Thyrotoxicosis, unspecified without thyrotoxic crisis or storm: Secondary | ICD-10-CM | POA: Insufficient documentation

## 2023-08-16 DIAGNOSIS — O99282 Endocrine, nutritional and metabolic diseases complicating pregnancy, second trimester: Secondary | ICD-10-CM

## 2023-08-16 DIAGNOSIS — O09812 Supervision of pregnancy resulting from assisted reproductive technology, second trimester: Secondary | ICD-10-CM | POA: Diagnosis not present

## 2023-08-16 DIAGNOSIS — O9812 Syphilis complicating childbirth: Secondary | ICD-10-CM | POA: Diagnosis not present

## 2023-08-16 DIAGNOSIS — Z3A24 24 weeks gestation of pregnancy: Secondary | ICD-10-CM | POA: Insufficient documentation

## 2023-08-16 DIAGNOSIS — O09819 Supervision of pregnancy resulting from assisted reproductive technology, unspecified trimester: Secondary | ICD-10-CM

## 2023-08-16 DIAGNOSIS — Z362 Encounter for other antenatal screening follow-up: Secondary | ICD-10-CM | POA: Insufficient documentation

## 2023-08-16 DIAGNOSIS — E039 Hypothyroidism, unspecified: Secondary | ICD-10-CM | POA: Diagnosis not present

## 2023-08-16 DIAGNOSIS — O283 Abnormal ultrasonic finding on antenatal screening of mother: Secondary | ICD-10-CM | POA: Diagnosis not present

## 2023-08-16 NOTE — Progress Notes (Signed)
 Patient information  Patient Name: Jill Willis [facility]  Patient MRN:   [id number]  Referring practice: MFM Referring Provider: [facility]  Problem List   Patient Active Problem List   Diagnosis Date Noted   History of gestational hypertension 05/10/2023   History of depression 05/10/2023   Multigravida of advanced maternal age 36/08/2023   Hypothyroidism 05/10/2023   Pregnancy resulting from in-vitro fertilization 06/29/2020    Maternal Fetal medicine Consult  Jill Willis is a 38 y.o. G2P1001 at [redacted]w[redacted]d here for ultrasound and consultation. Jill Willis is doing well today with no acute concerns. Today we focused on the following:   The patient reports left-sided abdominal pain has been going on for a few weeks.  She reports the pain as sharp and nervelike.  She is tender to palpation over the area where the external and internal oblique meet.  I discussed this is likely myofascial pain.  She denies vaginal bleeding but did have a episode of blood in her urine a few weeks ago.  Urinalysis was negative.  I discussed this is likely normal myofascial pain from pregnancy but she should call her OB if she has any bright red vaginal bleeding.  She reports that her thyroid  is doing well and her OB is following this.  Due to this indication we will continue to follow her with growth ultrasounds every 6 weeks.  There continues to be mild fetal urinary tract dilation.  I discussed the need for future ultrasounds and possible postnatal evaluation if it persist after 32 weeks.  The patient had time to ask questions that were answered to her satisfaction.  She verbalized understanding and agrees to proceed with the plan below.  Sonographic findings Single intrauterine pregnancy at 24w 1d.  Fetal cardiac activity:  Observed and appears normal. Presentation: Breech. Interval fetal anatomy appears normal except for mild left urinary tract dilation at 0.61  centimeters. Fetal biometry shows the estimated fetal weight at the 77 percentile. Amniotic fluid volume: Within normal limits. MVP: 4.36 cm. Placenta: Posterior.  There are limitations of prenatal ultrasound such as the inability to detect certain abnormalities due to poor visualization. Various factors such as fetal position, gestational age and maternal body habitus may increase the difficulty in visualizing the fetal anatomy.    Recommendations -Serial growth ultrasounds every 6 weeks until delivery - Pediatric urology consultation if the UTD is present at the time of birth  Review of Systems: A review of systems was performed and was negative except per HPI   Vitals and Physical Exam    08/16/2023    1:58 PM 07/20/2023    1:50 PM 12/21/2020    6:01 AM  Vitals with BMI  Systolic 119 118 875  Diastolic 74 72 74  Pulse 83 83 75    Sitting comfortably on the sonogram table Nonlabored breathing Normal rate and rhythm Abdomen is tender to palpation over the left oblique muscle  Past pregnancies OB History  Gravida Para Term Preterm AB Living  2 1 1   1   SAB IAB Ectopic Multiple Live Births     0 1    # Outcome Date GA Lbr Len/2nd Weight Sex Type Anes PTL Lv  2 Current           1 Term 12/19/20 [redacted]w[redacted]d 22:38 / 01:28 6 lb 5.1 oz (2.866 kg) M Vag-Vacuum EPI  LIV     I spent 20 minutes reviewing the patients chart, including labs and images as  well as counseling the patient about her medical conditions. Greater than 50% of the time was spent in direct face-to-face patient counseling.  Delora Smaller  MFM, [place]   08/16/2023  2:30 PM

## 2023-09-27 ENCOUNTER — Ambulatory Visit

## 2023-09-27 ENCOUNTER — Ambulatory Visit: Attending: Maternal & Fetal Medicine | Admitting: Maternal & Fetal Medicine

## 2023-09-27 DIAGNOSIS — O35EXX0 Maternal care for other (suspected) fetal abnormality and damage, fetal genitourinary anomalies, not applicable or unspecified: Secondary | ICD-10-CM

## 2023-09-27 DIAGNOSIS — E039 Hypothyroidism, unspecified: Secondary | ICD-10-CM | POA: Diagnosis not present

## 2023-09-27 DIAGNOSIS — O09813 Supervision of pregnancy resulting from assisted reproductive technology, third trimester: Secondary | ICD-10-CM

## 2023-09-27 DIAGNOSIS — Z3A30 30 weeks gestation of pregnancy: Secondary | ICD-10-CM | POA: Diagnosis not present

## 2023-09-27 DIAGNOSIS — E0590 Thyrotoxicosis, unspecified without thyrotoxic crisis or storm: Secondary | ICD-10-CM

## 2023-09-27 DIAGNOSIS — O3663X0 Maternal care for excessive fetal growth, third trimester, not applicable or unspecified: Secondary | ICD-10-CM | POA: Insufficient documentation

## 2023-09-27 DIAGNOSIS — O99283 Endocrine, nutritional and metabolic diseases complicating pregnancy, third trimester: Secondary | ICD-10-CM

## 2023-09-27 DIAGNOSIS — O09523 Supervision of elderly multigravida, third trimester: Secondary | ICD-10-CM | POA: Insufficient documentation

## 2023-09-27 DIAGNOSIS — O283 Abnormal ultrasonic finding on antenatal screening of mother: Secondary | ICD-10-CM | POA: Diagnosis not present

## 2023-09-27 NOTE — Progress Notes (Signed)
 After review, MFM consult with provider is not indicated for today  Jill Nathanel Pipe, MD 09/27/2023 5:00 PM  [facility]

## 2023-11-02 ENCOUNTER — Inpatient Hospital Stay (HOSPITAL_COMMUNITY)
Admission: AD | Admit: 2023-11-02 | Discharge: 2023-11-02 | Disposition: A | Discharge status: Home / Self Care | Attending: Obstetrics | Admitting: Obstetrics

## 2023-11-02 ENCOUNTER — Encounter: Payer: Self-pay | Admitting: Obstetrics and Gynecology

## 2023-11-02 ENCOUNTER — Inpatient Hospital Stay (HOSPITAL_COMMUNITY)

## 2023-11-02 ENCOUNTER — Encounter (HOSPITAL_COMMUNITY): Payer: Self-pay | Admitting: Obstetrics

## 2023-11-02 DIAGNOSIS — O133 Gestational [pregnancy-induced] hypertension without significant proteinuria, third trimester: Secondary | ICD-10-CM | POA: Insufficient documentation

## 2023-11-02 DIAGNOSIS — Z3493 Encounter for supervision of normal pregnancy, unspecified, third trimester: Secondary | ICD-10-CM

## 2023-11-02 DIAGNOSIS — O99283 Endocrine, nutritional and metabolic diseases complicating pregnancy, third trimester: Secondary | ICD-10-CM

## 2023-11-02 DIAGNOSIS — O09813 Supervision of pregnancy resulting from assisted reproductive technology, third trimester: Secondary | ICD-10-CM

## 2023-11-02 DIAGNOSIS — E0590 Thyrotoxicosis, unspecified without thyrotoxic crisis or storm: Secondary | ICD-10-CM | POA: Diagnosis not present

## 2023-11-02 DIAGNOSIS — O283 Abnormal ultrasonic finding on antenatal screening of mother: Secondary | ICD-10-CM | POA: Diagnosis not present

## 2023-11-02 DIAGNOSIS — O368130 Decreased fetal movements, third trimester, not applicable or unspecified: Secondary | ICD-10-CM | POA: Diagnosis present

## 2023-11-02 DIAGNOSIS — Z3A35 35 weeks gestation of pregnancy: Secondary | ICD-10-CM

## 2023-11-02 DIAGNOSIS — Z8759 Personal history of other complications of pregnancy, childbirth and the puerperium: Secondary | ICD-10-CM | POA: Diagnosis not present

## 2023-11-02 DIAGNOSIS — O26893 Other specified pregnancy related conditions, third trimester: Secondary | ICD-10-CM | POA: Diagnosis not present

## 2023-11-02 DIAGNOSIS — O09523 Supervision of elderly multigravida, third trimester: Secondary | ICD-10-CM

## 2023-11-02 DIAGNOSIS — O09819 Supervision of pregnancy resulting from assisted reproductive technology, unspecified trimester: Secondary | ICD-10-CM | POA: Diagnosis not present

## 2023-11-02 DIAGNOSIS — R030 Elevated blood-pressure reading, without diagnosis of hypertension: Secondary | ICD-10-CM | POA: Diagnosis not present

## 2023-11-02 DIAGNOSIS — R1011 Right upper quadrant pain: Secondary | ICD-10-CM

## 2023-11-02 HISTORY — DX: Depression, unspecified: F32.A

## 2023-11-02 HISTORY — DX: Hypothyroidism, unspecified: E03.9

## 2023-11-02 LAB — CBC WITH DIFFERENTIAL/PLATELET
Abs Immature Granulocytes: 0.09 K/uL — ABNORMAL HIGH (ref 0.00–0.07)
Basophils Absolute: 0 K/uL (ref 0.0–0.1)
Basophils Relative: 0 %
Eosinophils Absolute: 0.1 K/uL (ref 0.0–0.5)
Eosinophils Relative: 1 %
HCT: 34.1 % — ABNORMAL LOW (ref 36.0–46.0)
Hemoglobin: 10.9 g/dL — ABNORMAL LOW (ref 12.0–15.0)
Immature Granulocytes: 1 %
Lymphocytes Relative: 17 %
Lymphs Abs: 2.3 K/uL (ref 0.7–4.0)
MCH: 27.6 pg (ref 26.0–34.0)
MCHC: 32 g/dL (ref 30.0–36.0)
MCV: 86.3 fL (ref 80.0–100.0)
Monocytes Absolute: 0.9 K/uL (ref 0.1–1.0)
Monocytes Relative: 7 %
Neutro Abs: 10.2 K/uL — ABNORMAL HIGH (ref 1.7–7.7)
Neutrophils Relative %: 74 %
Platelets: 203 K/uL (ref 150–400)
RBC: 3.95 MIL/uL (ref 3.87–5.11)
RDW: 13.3 % (ref 11.5–15.5)
WBC: 13.7 K/uL — ABNORMAL HIGH (ref 4.0–10.5)
nRBC: 0 % (ref 0.0–0.2)

## 2023-11-02 LAB — COMPREHENSIVE METABOLIC PANEL WITH GFR
ALT: 13 U/L (ref 0–44)
AST: 19 U/L (ref 15–41)
Albumin: 2.2 g/dL — ABNORMAL LOW (ref 3.5–5.0)
Alkaline Phosphatase: 134 U/L — ABNORMAL HIGH (ref 38–126)
Anion gap: 11 (ref 5–15)
BUN: 6 mg/dL (ref 6–20)
CO2: 22 mmol/L (ref 22–32)
Calcium: 8.8 mg/dL — ABNORMAL LOW (ref 8.9–10.3)
Chloride: 102 mmol/L (ref 98–111)
Creatinine, Ser: 0.67 mg/dL (ref 0.44–1.00)
GFR, Estimated: >60 mL/min (ref >60)
Glucose, Bld: 89 mg/dL (ref 70–99)
Potassium: 4.3 mmol/L (ref 3.5–5.1)
Sodium: 135 mmol/L (ref 135–145)
Total Bilirubin: 0.2 mg/dL (ref 0.0–1.2)
Total Protein: 5.5 g/dL — ABNORMAL LOW (ref 6.5–8.1)

## 2023-11-02 LAB — PROTEIN / CREATININE RATIO, URINE
Creatinine, Urine: 50 mg/dL
Protein Creatinine Ratio: 0.12 mg/mg{creat} (ref 0.00–0.15)
Total Protein, Urine: 6 mg/dL

## 2023-11-02 NOTE — MAU Note (Signed)
 Jill Willis is a 38 y.o. at [redacted]w[redacted]d here in MAU reporting: really just hasn't felt much movement today, esp this afternoon.  Called office, did a kick count, only 4 in about 2 hrs, so told to come in.  Denies bleeding, LOF or pain.  While in triage, states now he is moving.  Onset of complaint: today Pain score: mild (HA) Vitals:   11/02/23 1648  BP: (!) 143/95  Pulse: (!) 106  Resp: 18  Temp: 98.5 F (36.9 C)  SpO2: 98%     FHT:158 Lab orders placed from triage:  urine collected   Reports HA off and on this wk, BP upper 130's / upper 80's.  Occ sees spots.  Reports nagging pain in RUQ.  Denies increase in swelling. Was induced with first for elevated BP.

## 2023-11-02 NOTE — MAU Provider Note (Signed)
 History     CSN: [id number]  Arrival date and time: 11/02/23 1630   Event Date/Time   First Provider Initiated Contact with Patient 11/02/23 1719      Chief Complaint  Patient presents with   Decreased Fetal Movement   Jill Willis , a  38 y.o. G2P1001 at [redacted]w[redacted]d presents to MAU with complaints of decreased fetal movement. Patient reports only 1-2 movements in just over an hour. She notified the office and while waiting to hear back she had about 4-5 more movements, but she reports it was still less than normal. Attempted to rest, eat something sugary, and cold without improvement. She denies vaginal bleeding leaking of fluid and contractions.   She also reports a consistent headache and a nagging RUQ pain that has been there for 2 days. She states that her headache is currently like a 4 and states that she wakes up with it. Denies attempting to relieve symptoms and denies worsening or alleviating symptoms. The upper abdominal pain she rates as a 2 and states that its intermittent. She denies a hx of complications with her liver and gallbladder. Reports her BPs have been steadily increasing over the last few weeks of pregnancy, but denies any severe range BPs. Denies increased swelling or visual disturbances.          OB History     Gravida  2   Para  1   Term  1   Preterm      AB      Living  1      SAB      IAB      Ectopic      Multiple  0   Live Births  1           Past Medical History:  Diagnosis Date   Chickenpox    Depression    PPD   Hypothyroidism    Preeclampsia 12/17/2020   Sinusitis    Sore throat     Past Surgical History:  Procedure Laterality Date   egg retrieval     TONSILLECTOMY      Family History  Problem Relation Age of Onset   Hypothyroidism Mother    Diabetes Paternal Grandfather     Social History   Tobacco Use   Smoking status: Never   Smokeless tobacco: Never  Vaping Use   Vaping status: Never  Used  Substance Use Topics   Alcohol use: Not Currently    Comment: very little   Drug use: No    Allergies:  Allergies  Allergen Reactions   Amoxicillin  Hives    Medications Prior to Admission  Medication Sig Dispense Refill Last Dose/Taking   aspirin EC 81 MG tablet Take 81 mg by mouth daily. Swallow whole.   11/02/2023   Prenatal Vit-Fe Fumarate-FA (PRENATAL MULTIVITAMIN) TABS tablet Take 1 tablet by mouth daily at 12 noon.   11/02/2023   acetaminophen  (TYLENOL ) 500 MG tablet Take 1,000 mg by mouth every 6 (six) hours as needed for headache.   More than a month    Review of Systems  Constitutional:  Negative for chills, fatigue and fever.  Eyes:  Negative for pain and visual disturbance.  Respiratory:  Negative for apnea, shortness of breath and wheezing.   Cardiovascular:  Negative for chest pain and palpitations.  Gastrointestinal:  Positive for abdominal pain. Negative for constipation, diarrhea, nausea and vomiting.  Genitourinary:  Negative for difficulty urinating, dysuria, pelvic pain, vaginal bleeding, vaginal discharge and  vaginal pain.  Musculoskeletal:  Negative for back pain and joint swelling.  Neurological:  Positive for headaches. Negative for dizziness, seizures, weakness and numbness.  Psychiatric/Behavioral:  Negative for suicidal ideas.    Physical Exam   Blood pressure 126/81, pulse 87, temperature 98.5 F (36.9 C), temperature source Oral, resp. rate 18, height 5' 2 (1.575 m), weight 83.4 kg, last menstrual period 02/27/2023, SpO2 98%, unknown if currently breastfeeding.  Physical Exam Vitals and nursing note reviewed.  Constitutional:      General: She is not in acute distress.    Appearance: Normal appearance.  HENT:     Head: Normocephalic.  Pulmonary:     Effort: Pulmonary effort is normal.  Abdominal:     Palpations: Abdomen is soft.     Tenderness: There is no abdominal tenderness.  Musculoskeletal:        General: No swelling. Normal  range of motion.     Cervical back: Normal range of motion.  Skin:    General: Skin is warm and dry.  Neurological:     Mental Status: She is alert and oriented to person, place, and time.     Deep Tendon Reflexes: Reflexes normal.  Psychiatric:        Mood and Affect: Mood normal.    FHT: 150 bpm with moderate variability. 15x15 accels present. No decels  Toco: UI with occasional contractions ( patient unaware)    Patient Vitals for the past 24 hrs:  BP Temp Temp src Pulse Resp SpO2 Height Weight  11/02/23 1920 -- -- -- -- -- 99 % -- --  11/02/23 1919 (!) 136/94 -- -- (!) 110 -- -- -- --  11/02/23 1815 126/81 -- -- 87 -- -- -- --  11/02/23 1800 127/89 -- -- (!) 108 -- -- -- --  11/02/23 1745 126/79 -- -- 96 -- -- -- --  11/02/23 1730 131/86 -- -- 96 -- -- -- --  11/02/23 1715 130/85 -- -- (!) 103 -- -- -- --  11/02/23 1705 (!) 140/87 -- -- 97 -- -- -- --  11/02/23 1651 (!) 132/98 -- -- (!) 101 -- -- -- --  11/02/23 1648 (!) 143/95 98.5 F (36.9 C) Oral (!) 106 18 98 % 5' 2 (1.575 m) 83.4 kg    MAU Course  Procedures  Orders Placed This Encounter  Procedures   US  MFM Fetal BPP Wo Non Stress   CBC with Differential/Platelet   Comprehensive metabolic panel   Protein / creatinine ratio, urine   Discharge patient Discharge disposition: 01-Home or Self Care; Discharge patient date: 11/02/2023   Results for orders placed or performed during the hospital encounter of 11/02/23 (from the past 24 hours)  Protein / creatinine ratio, urine     Status: None   Collection Time: 11/02/23  5:21 PM  Result Value Ref Range   Creatinine, Urine 50 mg/dL   Total Protein, Urine 6 mg/dL   Protein Creatinine Ratio 0.12 0.00 - 0.15 mg/mg[Cre]  CBC with Differential/Platelet     Status: Abnormal   Collection Time: 11/02/23  5:48 PM  Result Value Ref Range   WBC 13.7 (H) 4.0 - 10.5 K/uL   RBC 3.95 3.87 - 5.11 MIL/uL   Hemoglobin 10.9 (L) 12.0 - 15.0 g/dL   HCT 65.8 (L) 63.9 - 53.9 %   MCV  86.3 80.0 - 100.0 fL   MCH 27.6 26.0 - 34.0 pg   MCHC 32.0 30.0 - 36.0 g/dL   RDW 86.6 88.4 -  15.5 %   Platelets 203 150 - 400 K/uL   nRBC 0.0 0.0 - 0.2 %   Neutrophils Relative % 74 %   Neutro Abs 10.2 (H) 1.7 - 7.7 K/uL   Lymphocytes Relative 17 %   Lymphs Abs 2.3 0.7 - 4.0 K/uL   Monocytes Relative 7 %   Monocytes Absolute 0.9 0.1 - 1.0 K/uL   Eosinophils Relative 1 %   Eosinophils Absolute 0.1 0.0 - 0.5 K/uL   Basophils Relative 0 %   Basophils Absolute 0.0 0.0 - 0.1 K/uL   Immature Granulocytes 1 %   Abs Immature Granulocytes 0.09 (H) 0.00 - 0.07 K/uL  Comprehensive metabolic panel     Status: Abnormal   Collection Time: 11/02/23  5:48 PM  Result Value Ref Range   Sodium 135 135 - 145 mmol/L   Potassium 4.3 3.5 - 5.1 mmol/L   Chloride 102 98 - 111 mmol/L   CO2 22 22 - 32 mmol/L   Glucose, Bld 89 70 - 99 mg/dL   BUN 6 6 - 20 mg/dL   Creatinine, Ser 9.32 0.44 - 1.00 mg/dL   Calcium 8.8 (L) 8.9 - 10.3 mg/dL   Total Protein 5.5 (L) 6.5 - 8.1 g/dL   Albumin 2.2 (L) 3.5 - 5.0 g/dL   AST 19 15 - 41 U/L   ALT 13 0 - 44 U/L   Alkaline Phosphatase 134 (H) 38 - 126 U/L   Total Bilirubin 0.2 0.0 - 1.2 mg/dL   GFR, Estimated >39 >39 mL/min   Anion gap 11 5 - 15     Media Information     MDM - Patient was provided a kick count clicker.  - Given hx of gHTN in previous pregnancy and elevated BPs in MAU with headache and RUQ pain, PreE labs ordered.  - Offered pain management however patient declined.  - Patient only hit clicker 2 times while awaiting lab results, BPP ordered.  - White count mildly elevated, can be a normal finding in pregnancy. Hgb at 10.9 pt asymptomatic.  - Liver enzymes normal.  - PCR normal.  - BPs spontaneously improved while waiting in MAU. Low suspicion for PreE at this time. Likely gHTN.  - Upon return from [facility] patient reports vigorous and frequent fetal movement.  - BPP 8/8 with reactive NST for 10/10 - plan for discharge at this time.    Assessment and Plan   1. Movement of fetus present during pregnancy in third trimester   2. [redacted] weeks gestation of pregnancy   3. Elevated BP without diagnosis of hypertension   4. History of gestational hypertension   5. Right upper quadrant pain    - Reviewed worsening signs and return precautions.  - Recommended to continue to monitor BPs at home.  - Patient has an appt scheduled for Monday, encouraged to keep appt.  - FHT appropriate for gestational age at time of discharge.  - Discussed fetal movement expectations for 3rd trimester and maternal perception of movement based on fetal positioning. Education provided on kick counts. - On call provider made aware of plan of care and to monitor BPs in office.  - Patient discharged home in stable condition and may return to MAU as needed.   Jill CHRISTELLA Cedar, MSN CNM  11/02/2023, 7:12 PM

## 2023-11-05 LAB — OB RESULTS CONSOLE GBS: GBS: NEGATIVE

## 2023-11-07 ENCOUNTER — Telehealth (HOSPITAL_COMMUNITY): Payer: Self-pay | Admitting: *Deleted

## 2023-11-07 NOTE — Telephone Encounter (Signed)
 Preadmission screen

## 2023-11-08 ENCOUNTER — Ambulatory Visit

## 2023-11-08 ENCOUNTER — Encounter (HOSPITAL_COMMUNITY): Payer: Self-pay | Admitting: *Deleted

## 2023-11-08 ENCOUNTER — Other Ambulatory Visit: Payer: Self-pay | Admitting: Maternal & Fetal Medicine

## 2023-11-08 ENCOUNTER — Telehealth (HOSPITAL_COMMUNITY): Payer: Self-pay | Admitting: *Deleted

## 2023-11-08 ENCOUNTER — Ambulatory Visit: Attending: Maternal & Fetal Medicine | Admitting: Obstetrics

## 2023-11-08 DIAGNOSIS — E0590 Thyrotoxicosis, unspecified without thyrotoxic crisis or storm: Secondary | ICD-10-CM | POA: Diagnosis not present

## 2023-11-08 DIAGNOSIS — O3663X0 Maternal care for excessive fetal growth, third trimester, not applicable or unspecified: Secondary | ICD-10-CM | POA: Diagnosis not present

## 2023-11-08 DIAGNOSIS — Z362 Encounter for other antenatal screening follow-up: Secondary | ICD-10-CM | POA: Diagnosis not present

## 2023-11-08 DIAGNOSIS — O09523 Supervision of elderly multigravida, third trimester: Secondary | ICD-10-CM | POA: Insufficient documentation

## 2023-11-08 DIAGNOSIS — O133 Gestational [pregnancy-induced] hypertension without significant proteinuria, third trimester: Secondary | ICD-10-CM

## 2023-11-08 DIAGNOSIS — O3660X0 Maternal care for excessive fetal growth, unspecified trimester, not applicable or unspecified: Secondary | ICD-10-CM | POA: Insufficient documentation

## 2023-11-08 DIAGNOSIS — O403XX0 Polyhydramnios, third trimester, not applicable or unspecified: Secondary | ICD-10-CM

## 2023-11-08 DIAGNOSIS — O09813 Supervision of pregnancy resulting from assisted reproductive technology, third trimester: Secondary | ICD-10-CM

## 2023-11-08 DIAGNOSIS — O35EXX0 Maternal care for other (suspected) fetal abnormality and damage, fetal genitourinary anomalies, not applicable or unspecified: Secondary | ICD-10-CM

## 2023-11-08 DIAGNOSIS — O283 Abnormal ultrasonic finding on antenatal screening of mother: Secondary | ICD-10-CM | POA: Diagnosis not present

## 2023-11-08 DIAGNOSIS — O99283 Endocrine, nutritional and metabolic diseases complicating pregnancy, third trimester: Secondary | ICD-10-CM | POA: Diagnosis not present

## 2023-11-08 DIAGNOSIS — O358XX0 Maternal care for other (suspected) fetal abnormality and damage, not applicable or unspecified: Secondary | ICD-10-CM

## 2023-11-08 DIAGNOSIS — Z3A36 36 weeks gestation of pregnancy: Secondary | ICD-10-CM

## 2023-11-08 NOTE — Telephone Encounter (Signed)
 Preadmission screen

## 2023-11-08 NOTE — Progress Notes (Signed)
 MFM Consult Note  Jill Willis is currently at 36 weeks and 1 day.  She has been followed due to advanced maternal age and hydronephrosis noted in one of the fetal kidneys during her prior ultrasound exams.  She was evaluated in the MAU last week and was diagnosed with gestational hypertension.    She denies any other problems since her last exam and has screened negative for gestational diabetes.  Sonographic findings Single intrauterine pregnancy at 36w 1d.  Fetal cardiac activity:  Observed and appears normal. Presentation: Cephalic. Fetal biometry shows the estimated fetal weight of 7 pounds 4 ounces which measures at the 90th percentile. Amniotic fluid volume: Polyhydramnios.  AFI: 33.6 cm.  MVP: 14.17 cm. Placenta: Posterior. BPP: 8/8.  Left hydronephrosis measuring 1.3 cm dilated continues to be noted in the fetal kidney.  The right fetal kidney appeared within normal limits.    She was advised that her baby will require further imaging studies of the kidneys after birth to determine if any treatments are necessary for the hydronephrosis.    She was advised to notify her pediatricians regarding the hydronephrosis that was noted during her prenatal ultrasound exams.    She was reassured that as polyhydramnios was noted on today's exam, that one or both of the fetal kidneys are functioning normally.    Due to gestational hypertension, she already has an induction of labor scheduled on November 14, 2023 (in 6 days).    No further exams were scheduled in our office.    The patient stated that all of her questions were answered today.  A total of 20 minutes was spent counseling and coordinating the care for this patient.  Greater than 50% of the time was spent in direct face-to-face contact.

## 2023-11-14 ENCOUNTER — Inpatient Hospital Stay (HOSPITAL_COMMUNITY)
Admission: RE | Admit: 2023-11-14 | Discharge: 2023-11-16 | DRG: 807 | Disposition: A | Discharge status: Home / Self Care | Payer: Self-pay | Attending: Obstetrics | Admitting: Obstetrics

## 2023-11-14 ENCOUNTER — Other Ambulatory Visit: Payer: Self-pay

## 2023-11-14 ENCOUNTER — Encounter (HOSPITAL_COMMUNITY): Payer: Self-pay | Admitting: Obstetrics

## 2023-11-14 ENCOUNTER — Inpatient Hospital Stay (HOSPITAL_COMMUNITY): Admitting: Anesthesiology

## 2023-11-14 ENCOUNTER — Inpatient Hospital Stay (HOSPITAL_COMMUNITY)

## 2023-11-14 DIAGNOSIS — O35EXX0 Maternal care for other (suspected) fetal abnormality and damage, fetal genitourinary anomalies, not applicable or unspecified: Secondary | ICD-10-CM | POA: Diagnosis present

## 2023-11-14 DIAGNOSIS — O326XX0 Maternal care for compound presentation, not applicable or unspecified: Secondary | ICD-10-CM | POA: Diagnosis present

## 2023-11-14 DIAGNOSIS — O403XX0 Polyhydramnios, third trimester, not applicable or unspecified: Secondary | ICD-10-CM | POA: Diagnosis present

## 2023-11-14 DIAGNOSIS — Z3A37 37 weeks gestation of pregnancy: Secondary | ICD-10-CM

## 2023-11-14 DIAGNOSIS — O134 Gestational [pregnancy-induced] hypertension without significant proteinuria, complicating childbirth: Principal | ICD-10-CM | POA: Diagnosis present

## 2023-11-14 DIAGNOSIS — O133 Gestational [pregnancy-induced] hypertension without significant proteinuria, third trimester: Principal | ICD-10-CM | POA: Diagnosis present

## 2023-11-14 LAB — CBC
HCT: 31.4 % — ABNORMAL LOW (ref 36.0–46.0)
Hemoglobin: 10.1 g/dL — ABNORMAL LOW (ref 12.0–15.0)
MCH: 27.6 pg (ref 26.0–34.0)
MCHC: 32.2 g/dL (ref 30.0–36.0)
MCV: 85.8 fL (ref 80.0–100.0)
Platelets: 203 K/uL (ref 150–400)
RBC: 3.66 MIL/uL — ABNORMAL LOW (ref 3.87–5.11)
RDW: 14.1 % (ref 11.5–15.5)
WBC: 15 K/uL — ABNORMAL HIGH (ref 4.0–10.5)
nRBC: 0 % (ref 0.0–0.2)

## 2023-11-14 LAB — CBC WITH DIFFERENTIAL/PLATELET
Abs Immature Granulocytes: 0.1 K/uL — ABNORMAL HIGH (ref 0.00–0.07)
Basophils Absolute: 0 K/uL (ref 0.0–0.1)
Basophils Relative: 0 %
Eosinophils Absolute: 0.1 K/uL (ref 0.0–0.5)
Eosinophils Relative: 1 %
HCT: 32.2 % — ABNORMAL LOW (ref 36.0–46.0)
Hemoglobin: 10.2 g/dL — ABNORMAL LOW (ref 12.0–15.0)
Immature Granulocytes: 1 %
Lymphocytes Relative: 18 %
Lymphs Abs: 2.4 K/uL (ref 0.7–4.0)
MCH: 27.4 pg (ref 26.0–34.0)
MCHC: 31.7 g/dL (ref 30.0–36.0)
MCV: 86.6 fL (ref 80.0–100.0)
Monocytes Absolute: 0.9 K/uL (ref 0.1–1.0)
Monocytes Relative: 7 %
Neutro Abs: 9.6 K/uL — ABNORMAL HIGH (ref 1.7–7.7)
Neutrophils Relative %: 73 %
Platelets: 191 K/uL (ref 150–400)
RBC: 3.72 MIL/uL — ABNORMAL LOW (ref 3.87–5.11)
RDW: 14 % (ref 11.5–15.5)
WBC: 13.1 K/uL — ABNORMAL HIGH (ref 4.0–10.5)
nRBC: 0 % (ref 0.0–0.2)

## 2023-11-14 LAB — PROTEIN / CREATININE RATIO, URINE
Creatinine, Urine: 34 mg/dL
Total Protein, Urine: <6 mg/dL

## 2023-11-14 LAB — COMPREHENSIVE METABOLIC PANEL WITH GFR
ALT: 13 U/L (ref 0–44)
AST: 29 U/L (ref 15–41)
Albumin: 2.1 g/dL — ABNORMAL LOW (ref 3.5–5.0)
Alkaline Phosphatase: 129 U/L — ABNORMAL HIGH (ref 38–126)
Anion gap: 11 (ref 5–15)
BUN: 7 mg/dL (ref 6–20)
CO2: 20 mmol/L — ABNORMAL LOW (ref 22–32)
Calcium: 8.2 mg/dL — ABNORMAL LOW (ref 8.9–10.3)
Chloride: 105 mmol/L (ref 98–111)
Creatinine, Ser: 0.66 mg/dL (ref 0.44–1.00)
GFR, Estimated: >60 mL/min (ref >60)
Glucose, Bld: 112 mg/dL — ABNORMAL HIGH (ref 70–99)
Potassium: 4.1 mmol/L (ref 3.5–5.1)
Sodium: 136 mmol/L (ref 135–145)
Total Bilirubin: 0.6 mg/dL (ref 0.0–1.2)
Total Protein: 5.4 g/dL — ABNORMAL LOW (ref 6.5–8.1)

## 2023-11-14 LAB — RPR: RPR Ser Ql: NONREACTIVE

## 2023-11-14 LAB — TYPE AND SCREEN
ABO/RH(D): O POS
Antibody Screen: NEGATIVE

## 2023-11-14 MED ORDER — ACETAMINOPHEN 325 MG PO TABS: 650.0000 mg | ORAL_TABLET | ORAL | Status: DC | PRN

## 2023-11-14 MED ORDER — LACTATED RINGERS AMNIOINFUSION: INTRAVENOUS | Status: DC

## 2023-11-14 MED ORDER — OXYTOCIN-SODIUM CHLORIDE 30-0.9 UT/500ML-% IV SOLN: 1.0000 m[IU]/min | INTRAVENOUS | Status: DC

## 2023-11-14 MED ORDER — LIDOCAINE HCL (PF) 1 % IJ SOLN: 30.0000 mL | INTRAMUSCULAR | Status: DC | PRN

## 2023-11-14 MED ORDER — EPHEDRINE 5 MG/ML INJ: 10.0000 mg | INTRAVENOUS | Status: DC | PRN

## 2023-11-14 MED ORDER — PHENYLEPHRINE 80 MCG/ML (10ML) SYRINGE FOR IV PUSH (FOR BLOOD PRESSURE SUPPORT): 80.0000 ug | PREFILLED_SYRINGE | INTRAVENOUS | Status: DC | PRN

## 2023-11-14 MED ORDER — EPHEDRINE 5 MG/ML INJ
10.0000 mg | INTRAVENOUS | Status: DC | PRN
Start: 2023-11-14 — End: 2023-11-15

## 2023-11-14 MED ORDER — DIPHENHYDRAMINE HCL 50 MG/ML IJ SOLN: 12.5000 mg | INTRAMUSCULAR | Status: DC | PRN

## 2023-11-14 MED ORDER — LIDOCAINE HCL (PF) 1 % IJ SOLN
INTRAMUSCULAR | Status: DC | PRN
  Administered 2023-11-14: 8 mL via EPIDURAL

## 2023-11-14 MED ORDER — MISOPROSTOL 25 MCG QUARTER TABLET
25.0000 ug | ORAL_TABLET | ORAL | Status: DC | PRN
  Administered 2023-11-14 (×2): 25 ug via VAGINAL
  Filled 2023-11-14 (×2): qty 1

## 2023-11-14 MED ORDER — SOD CITRATE-CITRIC ACID 500-334 MG/5ML PO SOLN
30.0000 mL | ORAL | Status: DC | PRN
Start: 2023-11-14 — End: 2023-11-15
  Administered 2023-11-14: 30 mL via ORAL
  Filled 2023-11-14: qty 30

## 2023-11-14 MED ORDER — LACTATED RINGERS IV SOLN: 500.0000 mL | INTRAVENOUS | Status: DC | PRN

## 2023-11-14 MED ORDER — OXYTOCIN BOLUS FROM INFUSION
333.0000 mL | Freq: Once | INTRAVENOUS | Status: AC
  Administered 2023-11-14: 333 mL via INTRAVENOUS

## 2023-11-14 MED ORDER — LACTATED RINGERS IV SOLN: INTRAVENOUS | Status: DC

## 2023-11-14 MED ORDER — FENTANYL CITRATE (PF) 100 MCG/2ML IJ SOLN
50.0000 ug | INTRAMUSCULAR | Status: DC | PRN
  Administered 2023-11-14 (×2): 100 ug via INTRAVENOUS
  Filled 2023-11-14 (×2): qty 2

## 2023-11-14 MED ORDER — OXYCODONE-ACETAMINOPHEN 5-325 MG PO TABS: 2.0000 | ORAL_TABLET | ORAL | Status: DC | PRN

## 2023-11-14 MED ORDER — FENTANYL-BUPIVACAINE-NACL 0.5-0.125-0.9 MG/250ML-% EP SOLN
12.0000 mL/h | EPIDURAL | Status: DC | PRN
  Administered 2023-11-14: 12 mL/h via EPIDURAL
  Filled 2023-11-14: qty 250

## 2023-11-14 MED ORDER — LACTATED RINGERS IV SOLN: 500.0000 mL | Freq: Once | INTRAVENOUS | Status: DC

## 2023-11-14 MED ORDER — FENTANYL-BUPIVACAINE-NACL 0.5-0.125-0.9 MG/250ML-% EP SOLN: 12.0000 mL/h | EPIDURAL | Status: DC | PRN

## 2023-11-14 MED ORDER — TRANEXAMIC ACID-NACL 1000-0.7 MG/100ML-% IV SOLN: 1000.0000 mg | INTRAVENOUS | Status: DC

## 2023-11-14 MED ORDER — LACTATED RINGERS IV SOLN
500.0000 mL | Freq: Once | INTRAVENOUS | Status: AC
  Administered 2023-11-14: 500 mL via INTRAVENOUS

## 2023-11-14 MED ORDER — OXYTOCIN-SODIUM CHLORIDE 30-0.9 UT/500ML-% IV SOLN
2.5000 [IU]/h | INTRAVENOUS | Status: DC
  Administered 2023-11-14: 2.5 [IU]/h via INTRAVENOUS
  Filled 2023-11-14: qty 500

## 2023-11-14 MED ORDER — TRANEXAMIC ACID-NACL 1000-0.7 MG/100ML-% IV SOLN
INTRAVENOUS | Status: AC
  Filled 2023-11-14: qty 100

## 2023-11-14 MED ORDER — OXYTOCIN-SODIUM CHLORIDE 30-0.9 UT/500ML-% IV SOLN
1.0000 m[IU]/min | INTRAVENOUS | Status: DC
  Administered 2023-11-14: 1 m[IU]/min via INTRAVENOUS

## 2023-11-14 MED ORDER — TERBUTALINE SULFATE 1 MG/ML IJ SOLN: 0.2500 mg | Freq: Once | INTRAMUSCULAR | Status: DC | PRN

## 2023-11-14 MED ORDER — ONDANSETRON HCL 4 MG/2ML IJ SOLN
4.0000 mg | Freq: Four times a day (QID) | INTRAMUSCULAR | Status: DC | PRN
  Administered 2023-11-14: 4 mg via INTRAVENOUS
  Filled 2023-11-14: qty 2

## 2023-11-14 MED ORDER — OXYCODONE-ACETAMINOPHEN 5-325 MG PO TABS: 1.0000 | ORAL_TABLET | ORAL | Status: DC | PRN

## 2023-11-14 NOTE — Progress Notes (Signed)
 In to see and examine patient.   Since last exam 1 hour ago, fetal heart rate baseline has increased and recurrent variable decelerations with contraction have developed.  SVE 9/90/0.  Suspect variable decelerations related to rapid cervical change, however IUPC placed an will start an amnioinfusion.  Will also continue to rotate through position changes.   Will monitor closely; anticipate SVD

## 2023-11-14 NOTE — H&P (Addendum)
 38 y.o. G2P1001 @ [redacted]w[redacted]d presents for induction of labor for gestational hypertension.  Otherwise has good fetal movement and no bleeding.  Has received 2 doses of misoprostol  overnight.  Starting to feel some cramping  Pregnancy complicated by: Gestational hypertension Polyhydramnios:  Most recent AFI on 11/6 with MFM 33 cm Fetal hydronephrosis of left kidney measuring 1.3 cm dilation on 11/6.  Normal right kidney Advanced maternal age--PGT-A tested embryo, low risk NIPT IVF pregnancy--detailed anatomy scan by MFM, normal fetal ECHO History of hyperthyroidism.  Unsure if graves disease.  TRAb levels normal.  Has not required thyroid  replacement or meds this pregnancy or in the past   Past Medical History:  Diagnosis Date   Chickenpox    Depression    PPD   History of gestational hypertension    Hypothyroidism    Preeclampsia 12/17/2020   Pregnancy induced hypertension    Sinusitis    Sore throat     Past Surgical History:  Procedure Laterality Date   egg retrieval     sinoplasty     TONSILLECTOMY      OB History  Gravida Para Term Preterm AB Living  2 1 1   1   SAB IAB Ectopic Multiple Live Births     0 1    # Outcome Date GA Lbr Len/2nd Weight Sex Type Anes PTL Lv  2 Current           1 Term 12/19/20 [redacted]w[redacted]d 22:38 / 01:28 2866 g M Vag-Vacuum EPI  LIV    Social History   Socioeconomic History   Marital status: Married    Spouse name: Not on file   Number of children: Not on file   Years of education: Not on file   Highest education level: Not on file  Occupational History   Not on file  Tobacco Use   Smoking status: Never   Smokeless tobacco: Never  Vaping Use   Vaping status: Never Used  Substance and Sexual Activity   Alcohol use: Not Currently    Comment: very little   Drug use: No   Sexual activity: Yes  Other Topics Concern   Not on file  Social History Narrative   Not on file   Social Drivers of Health   Financial Resource Strain: Not on file   Food Insecurity: No Food Insecurity (11/14/2023)   Hunger Vital Sign    Worried About Running Out of Food in the Last Year: Never true    Ran Out of Food in the Last Year: Never true  Transportation Needs: No Transportation Needs (11/14/2023)   PRAPARE - Administrator, Civil Service (Medical): No    Lack of Transportation (Non-Medical): No  Physical Activity: Not on file  Stress: Not on file  Social Connections: Not on file  Intimate Partner Violence: Not At Risk (11/14/2023)   Humiliation, Afraid, Rape, and Kick questionnaire    Fear of Current or Ex-Partner: No    Emotionally Abused: No    Physically Abused: No    Sexually Abused: No   Amoxicillin     Prenatal Transfer Tool  Maternal Diabetes: No Genetic Screening: Normal Maternal Ultrasounds/Referrals: Fetal renal pyelectasis Fetal Ultrasounds or other Referrals:  Referred to Materal Fetal Medicine  Maternal Substance Abuse:  No Significant Maternal Medications:  None Significant Maternal Lab Results: Group B Strep negative Vaccines: s/p flu, tdap, RSV vaccine (< 2 wks ago on 11/3 or 11/4)  ABO, Rh: --/--/O POS (11/12 0033) Antibody: NEG (11/12 0033)  Rubella: Immune (05/08 0000) RPR: NON REACTIVE (11/12 0030)  HBsAg: Negative (05/08 0000)  HIV: Non-reactive (05/08 0000)  GBS: Negative/-- (11/03 0000)     Vitals:   11/14/23 0651 11/14/23 0755  BP: 124/84 (!) 121/58  Pulse: 82 85  Resp: 16   Temp:  98.1 F (36.7 C)  SpO2:       General:  NAD Abdomen:  soft, gravid, EFW 7-7.5# Ex:  no edema SVE:  1.5 cm / 30 / high.  Foley balloon placed and filled with 60 mL fluid FHTs:  150s, moderate variability, + accelerations, category 1 Toco:  uterine irritability  11/6: MFM f/u US : cephalic, EFW 7lb 4oz (90%), AC >99%. AFI 33, BPP 8/8. Nml anatomy excpet for left UTD 13mm 11/11:  BPP cephalic, AFI 21  A/P   38 y.o. G2P1001 [redacted]w[redacted]d presents for induction of labor for gestational hypertension at term IOL:  has received 2 doses of misoprostol .  Foley balloon placed.  Will start low dose pitocin  GHTN: BPs mild range; labs normal on admission.  Will monitor closely History of hyperthyroidism:  Has not required meds for many years Suspected fetal macrosomia--EFW last week 7lb 4 oz (>90%).  PP 6lb 5oz.  Pushed < 1.5hr; VAVD for NRFS (fetal tachycardia, recurrent varaible decelerations)  GBS negative    Milano Rosevear GEFFEL Yaakov Saindon

## 2023-11-14 NOTE — Progress Notes (Signed)
 Comfortable with epidural  BP 110/78   Pulse 91   Temp 98.1 F (36.7 C) (Oral)   Resp 16   Ht 5' 2 (1.575 m)   Wt 87.7 kg   LMP 02/27/2023   SpO2 100%   BMI 35.36 kg/m   Toco: q3-5 minutes EFM: 130s, moderate variability, occasional early decelerations, category 1 SVE: 6/70/-3  A/P: G2P1 @ [redacted]w[redacted]d with IOL for GHTN IOL: s/p misoprostol  x 2, foley balloon, AROM.  Entering active labor.  Continue pitocin  GHTN: BPs mild range Fetal UTD of left kidney: PP by peds as necessary History of hyperthyroidism by patient report:  denies requiring medication

## 2023-11-14 NOTE — Anesthesia Preprocedure Evaluation (Signed)
 Anesthesia Evaluation  Patient identified by MRN, date of birth, ID band Patient awake    Reviewed: Allergy & Precautions, H&P , NPO status , Patient's Chart, lab work & pertinent test results, reviewed documented beta blocker date and time   Airway Mallampati: II  TM Distance: >3 FB Neck ROM: full    Dental no notable dental hx.    Pulmonary neg pulmonary ROS   Pulmonary exam normal breath sounds clear to auscultation       Cardiovascular hypertension, negative cardio ROS Normal cardiovascular exam Rhythm:regular Rate:Normal     Neuro/Psych  PSYCHIATRIC DISORDERS  Depression    negative neurological ROS  negative psych ROS   GI/Hepatic negative GI ROS, Neg liver ROS,,,  Endo/Other  negative endocrine ROSHypothyroidism    Renal/GU negative Renal ROS  negative genitourinary   Musculoskeletal   Abdominal   Peds  Hematology negative hematology ROS (+)   Anesthesia Other Findings   Reproductive/Obstetrics (+) Pregnancy                              Anesthesia Physical Anesthesia Plan  ASA: 2  Anesthesia Plan: Epidural   Post-op Pain Management: Minimal or no pain anticipated   Induction: Intravenous  PONV Risk Score and Plan: 2 and Treatment may vary due to age or medical condition  Airway Management Planned:   Additional Equipment: Fetal Monitoring  Intra-op Plan:   Post-operative Plan:   Informed Consent: I have reviewed the patients History and Physical, chart, labs and discussed the procedure including the risks, benefits and alternatives for the proposed anesthesia with the patient or authorized representative who has indicated his/her understanding and acceptance.       Plan Discussed with: Anesthesiologist and CRNA  Anesthesia Plan Comments:         Anesthesia Quick Evaluation

## 2023-11-14 NOTE — Progress Notes (Signed)
 FB out at 1220.  Requested an epidural--now comfortable with epidural  BP 110/67   Pulse 76   Temp 98.2 F (36.8 C) (Oral)   Resp 16   Ht 5' 2 (1.575 m)   Wt 87.7 kg   LMP 02/27/2023   SpO2 100%   BMI 35.36 kg/m  Toco: q EFM: 140s, moderate variability, + accelerations.  Category 1 SVE: 5/70/-3, AROM copious clear fluid  A/P: G2P1 @ [redacted]w[redacted]d with IOL for GHTN IOL: s/p misoprostol  x 2, foley balloon.  Now s/p AROM, continue pitocin  GHTN: BPs mild range Fetal UTD of left kidney: PP by peds as necessary History of hyperthyroidism by patient report:  denies requiring medication

## 2023-11-14 NOTE — Progress Notes (Addendum)
 Starting to have the shakes  BP 132/81   Pulse 97   Temp (!) 97.3 F (36.3 C) (Axillary)   Resp 16   Ht 5' 2 (1.575 m)   Wt 87.7 kg   LMP 02/27/2023   SpO2 100%   BMI 35.36 kg/m   Toco: q2-3 minutes EFM: 130s, moderate variability, + accels SVE: 6/80/-2, head feels much better applied than last check  A/P: G2P1 @ [redacted]w[redacted]d with IOL for GHTN IOL: s/p misoprostol  x 2, foley balloon, AROM. Although was 6 cm on last SVE, there has been a change in effacement and station and head feels much better applied.  Continue pitocin  GHTN: BPs mild range Fetal UTD of left kidney: PP by peds as necessary History of hyperthyroidism by patient report:  denies requiring medication GBS negative Anticipate SVD

## 2023-11-14 NOTE — Anesthesia Procedure Notes (Signed)
 Epidural Patient location during procedure: OB Start time: 11/14/2023 12:38 PM End time: 11/14/2023 12:42 PM  Staffing Anesthesiologist: Mallory Manus, MD  Preanesthetic Checklist Completed: patient identified, IV checked, site marked, risks and benefits discussed, surgical consent, monitors and equipment checked, pre-op evaluation and timeout performed  Epidural Patient position: sitting Prep: DuraPrep and site prepped and draped Patient monitoring: continuous pulse ox and blood pressure Approach: midline Location: L4-L5 Injection technique: LOR air  Needle:  Needle type: Tuohy  Needle gauge: 17 G Needle length: 9 cm and 9 Needle insertion depth: 6 cm Catheter type: closed end flexible Catheter size: 19 Gauge Catheter at skin depth: 12 cm Test dose: negative  Assessment Events: blood not aspirated, no cerebrospinal fluid, injection not painful, no injection resistance, no paresthesia and negative IV test

## 2023-11-15 ENCOUNTER — Encounter (HOSPITAL_COMMUNITY): Payer: Self-pay | Admitting: Obstetrics

## 2023-11-15 LAB — CBC
HCT: 25.9 % — ABNORMAL LOW (ref 36.0–46.0)
HCT: 28.6 % — ABNORMAL LOW (ref 36.0–46.0)
Hemoglobin: 8.4 g/dL — ABNORMAL LOW (ref 12.0–15.0)
Hemoglobin: 9.1 g/dL — ABNORMAL LOW (ref 12.0–15.0)
MCH: 27.8 pg (ref 26.0–34.0)
MCH: 28 pg (ref 26.0–34.0)
MCHC: 31.8 g/dL (ref 30.0–36.0)
MCHC: 32.4 g/dL (ref 30.0–36.0)
MCV: 86.3 fL (ref 80.0–100.0)
MCV: 87.5 fL (ref 80.0–100.0)
Platelets: 201 K/uL (ref 150–400)
Platelets: 202 K/uL (ref 150–400)
RBC: 3 MIL/uL — ABNORMAL LOW (ref 3.87–5.11)
RBC: 3.27 MIL/uL — ABNORMAL LOW (ref 3.87–5.11)
RDW: 14.1 % (ref 11.5–15.5)
RDW: 14.3 % (ref 11.5–15.5)
WBC: 22.8 K/uL — ABNORMAL HIGH (ref 4.0–10.5)
WBC: 25.5 K/uL — ABNORMAL HIGH (ref 4.0–10.5)
nRBC: 0 % (ref 0.0–0.2)
nRBC: 0 % (ref 0.0–0.2)

## 2023-11-15 MED ORDER — PRENATAL MULTIVITAMIN CH
1.0000 | ORAL_TABLET | Freq: Every day | ORAL | Status: DC
  Administered 2023-11-15 – 2023-11-16 (×2): 1 via ORAL
  Filled 2023-11-15 (×2): qty 1

## 2023-11-15 MED ORDER — SIMETHICONE 80 MG PO CHEW
80.0000 mg | CHEWABLE_TABLET | ORAL | Status: DC | PRN
  Administered 2023-11-16: 80 mg via ORAL
  Filled 2023-11-15: qty 1

## 2023-11-15 MED ORDER — ACETAMINOPHEN 325 MG PO TABS: 650.0000 mg | ORAL_TABLET | ORAL | Status: DC | PRN

## 2023-11-15 MED ORDER — COCONUT OIL OIL: 1.0000 | TOPICAL_OIL | Status: DC | PRN

## 2023-11-15 MED ORDER — OXYCODONE HCL 5 MG PO TABS: 5.0000 mg | ORAL_TABLET | ORAL | Status: DC | PRN

## 2023-11-15 MED ORDER — TETANUS-DIPHTH-ACELL PERTUSSIS 5-2-15.5 LF-MCG/0.5 IM SUSP: 0.5000 mL | Freq: Once | INTRAMUSCULAR | Status: DC

## 2023-11-15 MED ORDER — DIPHENHYDRAMINE HCL 25 MG PO CAPS: 25.0000 mg | ORAL_CAPSULE | Freq: Four times a day (QID) | ORAL | Status: DC | PRN

## 2023-11-15 MED ORDER — WITCH HAZEL-GLYCERIN EX PADS
1.0000 | MEDICATED_PAD | CUTANEOUS | Status: DC | PRN
Start: 2023-11-15 — End: 2023-11-16

## 2023-11-15 MED ORDER — ONDANSETRON HCL 4 MG PO TABS
4.0000 mg | ORAL_TABLET | ORAL | Status: DC | PRN
Start: 2023-11-15 — End: 2023-11-16

## 2023-11-15 MED ORDER — IBUPROFEN 600 MG PO TABS
600.0000 mg | ORAL_TABLET | Freq: Four times a day (QID) | ORAL | Status: DC
  Administered 2023-11-15 – 2023-11-16 (×4): 600 mg via ORAL
  Filled 2023-11-15 (×6): qty 1

## 2023-11-15 MED ORDER — BENZOCAINE-MENTHOL 20-0.5 % EX AERO
1.0000 | INHALATION_SPRAY | CUTANEOUS | Status: DC | PRN
Start: 2023-11-15 — End: 2023-11-16
  Filled 2023-11-15: qty 56

## 2023-11-15 MED ORDER — ONDANSETRON HCL 4 MG/2ML IJ SOLN: 4.0000 mg | INTRAMUSCULAR | Status: DC | PRN

## 2023-11-15 MED ORDER — OXYCODONE HCL 5 MG PO TABS: 10.0000 mg | ORAL_TABLET | ORAL | Status: DC | PRN

## 2023-11-15 MED ORDER — DIBUCAINE (PERIANAL) 1 % EX OINT: 1.0000 | TOPICAL_OINTMENT | CUTANEOUS | Status: DC | PRN

## 2023-11-15 MED ORDER — SENNOSIDES-DOCUSATE SODIUM 8.6-50 MG PO TABS
2.0000 | ORAL_TABLET | ORAL | Status: DC
  Administered 2023-11-15 – 2023-11-16 (×2): 2 via ORAL
  Filled 2023-11-15 (×2): qty 2

## 2023-11-15 NOTE — Progress Notes (Addendum)
 Post Partum Day 1 Subjective: no complaints, up ad lib, voiding, and tolerating PO  Objective: Blood pressure 119/70, pulse 78, temperature 97.9 F (36.6 C), temperature source Oral, resp. rate 18, height 5' 2 (1.575 m), weight 87.7 kg, last menstrual period 02/27/2023, SpO2 100%, unknown if currently breastfeeding.  Physical Exam:  General: alert, cooperative, and appears stated age Lochia: appropriate Uterine Fundus: firm DVT Evaluation: No evidence of DVT seen on physical exam.  Recent Labs    11/15/23 0041 11/15/23 0419  HGB 9.1* 8.4*  HCT 28.6* 25.9*    Assessment/Plan: Plan for discharge tomorrow Breast-feeding Desires neonatal circumcision.  Infant to young to circumcised today will postpone till tomorrow.   LOS: 1 day   Marjorie Gull, MD 11/15/2023, 12:21 PM

## 2023-11-15 NOTE — Anesthesia Postprocedure Evaluation (Signed)
 Anesthesia Post Note  Patient: Jill Willis  Procedure(s) Performed: AN AD HOC LABOR EPIDURAL     Patient location during evaluation: Mother Baby Anesthesia Type: Epidural Level of consciousness: awake, oriented and awake and alert Pain management: pain level controlled Vital Signs Assessment: post-procedure vital signs reviewed and stable Respiratory status: spontaneous breathing, respiratory function stable and nonlabored ventilation Cardiovascular status: stable Postop Assessment: no headache, adequate PO intake, able to ambulate, patient able to bend at knees and no apparent nausea or vomiting Anesthetic complications: no   No notable events documented.  Last Vitals:  Vitals:   11/15/23 0247 11/15/23 0634  BP: 136/74 126/84  Pulse: 90 82  Resp: 20 20  Temp: 36.9 C 36.6 C  SpO2: 98% 99%    Last Pain:  Vitals:   11/15/23 0635  TempSrc:   PainSc: 0-No pain   Pain Goal:                   Jill Willis

## 2023-11-16 LAB — BIRTH TISSUE RECOVERY COLLECTION (PLACENTA DONATION)

## 2023-11-16 MED ORDER — IBUPROFEN 600 MG PO TABS: 600.0000 mg | ORAL_TABLET | Freq: Four times a day (QID) | ORAL | 0 refills | Status: AC | PRN

## 2023-11-16 NOTE — Discharge Instructions (Signed)
 Call office with any concerns [phone]

## 2023-11-16 NOTE — Discharge Summary (Signed)
 Postpartum Discharge Summary  Date of Service updated     Patient Name: Jill Willis DOB: 29-Sep-1985 MRN: [id number]  Date of admission: 11/14/2023 Delivery date:11/14/2023 Delivering provider: GRETTA GUMS Date of discharge: 11/16/2023  Admitting diagnosis: Gestational hypertension w/o significant proteinuria in 3rd trimester [O13.3] Intrauterine pregnancy: [redacted]w[redacted]d     Secondary diagnosis:  Principal Problem:   Gestational hypertension w/o significant proteinuria in 3rd trimester  Additional problems: Polyhydramnios, AMA, fetal hydronephrosis, IVF pregnancy, history of hyperthyroidism    Discharge diagnosis: Term Pregnancy Delivered and Gestational Hypertension                                              Post partum procedures:N/A Augmentation: AROM, Pitocin , Cytotec , and IP Foley Complications: None  Hospital course: Induction of Labor With Vaginal Delivery   38 y.o. yo G2P2002 at [redacted]w[redacted]d was admitted to the hospital 11/14/2023 for induction of labor.  Indication for induction: Gestational hypertension.  Patient had an uncomplicated labor course.  Membrane Rupture Time/Date: 1:48 PM,11/14/2023  Delivery Method:Vaginal, Spontaneous Operative Delivery:N/A Episiotomy: None Lacerations:  2nd degree;Vaginal Details of delivery can be found in separate delivery note.  Patient had an postpartum course and remained normotensive without medication. Patient is discharged home 11/16/23.  Newborn Data: Birth date:11/14/2023 Birth time:11:10 PM Gender:Female Living status:Living Apgars:7 ,9  Weight:3340 g  Magnesium Sulfate received: No BMZ received: No Rhophylac:N/A MMR:N/A T-DaP:Given prenatally Flu: Given prenatally RSV Vaccine received: Given prenatally Transfusion:No Immunizations administered: Immunization History  Administered Date(s) Administered   Td 09/16/2003   Tdap 03/09/2017    Physical exam  Vitals:   11/15/23 0953 11/15/23 1454 11/15/23 2218  11/16/23 0525  BP: 119/70 123/82 116/80 119/85  Pulse: 78 88 87 78  Resp: 18 18 16 18   Temp: 97.9 F (36.6 C) 97.9 F (36.6 C) 98.3 F (36.8 C) (!) 97.5 F (36.4 C)  TempSrc: Oral Oral Oral Oral  SpO2: 100% 100% 100% 99%  Weight:      Height:       General: alert, cooperative, and no distress Lochia: appropriate Uterine Fundus: firm DVT Evaluation: No cords or calf tenderness. No significant calf/ankle edema. Labs: Lab Results  Component Value Date   WBC 22.8 (H) 11/15/2023   HGB 8.4 (L) 11/15/2023   HCT 25.9 (L) 11/15/2023   MCV 86.3 11/15/2023   PLT 201 11/15/2023      Latest Ref Rng & Units 11/14/2023   12:30 AM  CMP  Glucose 70 - 99 mg/dL 887   BUN 6 - 20 mg/dL 7   Creatinine 9.55 - 8.99 mg/dL 9.33   Sodium 864 - 854 mmol/L 136   Potassium 3.5 - 5.1 mmol/L 4.1   Chloride 98 - 111 mmol/L 105   CO2 22 - 32 mmol/L 20   Calcium 8.9 - 10.3 mg/dL 8.2   Total Protein 6.5 - 8.1 g/dL 5.4   Total Bilirubin 0.0 - 1.2 mg/dL 0.6   Alkaline Phos 38 - 126 U/L 129   AST 15 - 41 U/L 29   ALT 0 - 44 U/L 13    Edinburgh Score:    11/16/2023   12:00 AM  Edinburgh Postnatal Depression Scale Screening Tool  I have been able to laugh and see the funny side of things. 0  I have looked forward with enjoyment to things. 0  I have blamed  myself unnecessarily when things went wrong. 1  I have been anxious or worried for no good reason. 1  I have felt scared or panicky for no good reason. 1  Things have been getting on top of me. 1  I have been so unhappy that I have had difficulty sleeping. 0  I have felt sad or miserable. 1  I have been so unhappy that I have been crying. 0  The thought of harming myself has occurred to me. 0  Edinburgh Postnatal Depression Scale Total 5      After visit meds:  Allergies as of 11/16/2023       Reactions   Amoxicillin  Hives        Medication List     STOP taking these medications    aspirin EC 81 MG tablet       TAKE these  medications    acetaminophen  500 MG tablet Commonly known as: TYLENOL  Take 1,000 mg by mouth every 6 (six) hours as needed for headache.   ibuprofen  600 MG tablet Commonly known as: ADVIL  Take 1 tablet (600 mg total) by mouth every 6 (six) hours as needed for cramping.   prenatal multivitamin Tabs tablet Take 1 tablet by mouth daily at 12 noon.         Discharge home in stable condition Infant Feeding: Bottle Infant Disposition:home with mother Discharge instruction: per After Visit Summary and Postpartum booklet. Activity: Advance as tolerated. Pelvic rest for 6 weeks.  Diet: routine diet Postpartum Appointment:6 weeks Additional Postpartum F/U: BP check 1 week Future Appointments:No future appointments. Follow up Visit:  Follow-up Information     Ob/Gyn, Land[facility] St[facility] Follow up in 1 week(s).   Why: for BP check and 6 weeks for postpartum visit Contact information: [address] [place] [address] [phone]                     11/16/2023 Larraine DELENA Sharps, DO

## 2023-11-16 NOTE — Progress Notes (Signed)
 MOB was referred for history of depression.  * Referral screened out by Clinical Social Worker because none of the following criteria appear to apply:  ~ History of depression during this pregnancy, or of post-partum depression following prior delivery.  ~ Diagnosis of depression within last 3 years Per OB notes, MOB did not indicate any signs/symptoms during her pregnancy.  Edinburgh score 5  Depression 2014  OR  * MOB's symptoms currently being treated with medication and/or therapy.  Please contact the Clinical Social Worker if needs arise, by [facility] request, or if MOB scores greater than 9/yes to question 10 on Edinburgh Postpartum Depression Screen.  Rosina Molt, [place] Clinical Social Worker [phone]

## 2023-11-16 NOTE — Progress Notes (Signed)
 Post Partum Day 2 Subjective: Ambulating and tolerating PO. Voiding without difficulty. Pain controlled  Objective: Blood pressure 119/85, pulse 78, temperature (!) 97.5 F (36.4 C), temperature source Oral, resp. rate 18, height 5' 2 (1.575 m), weight 87.7 kg, last menstrual period 02/27/2023, SpO2 99%, unknown if currently breastfeeding.  Physical Exam:  General: alert, cooperative, and no distress Lochia: appropriate Uterine Fundus: firm DVT Evaluation: No cords or calf tenderness. No significant calf/ankle edema.  Recent Labs    11/15/23 0041 11/15/23 0419  HGB 9.1* 8.4*  HCT 28.6* 25.9*    Assessment/Plan: 38 yo G2 now P2 PPD#2 s/p NSVD following IOL GHTN - PP: routine care  - GHTN: normotensive without medication  - Hx hyperthyroidism: TFTs were normal in pregnancy, has never required medication - Hx PPD: EPDS 5 - Dispo: DC home   LOS: 2 days   Jill DELENA Sharps, DO 11/16/2023, 1:03 PM

## 2023-11-27 ENCOUNTER — Telehealth (HOSPITAL_COMMUNITY): Payer: Self-pay | Admitting: *Deleted

## 2023-11-27 NOTE — Telephone Encounter (Signed)
 11/27/2023  Name: Jill Willis MRN: [id number] DOB: [birth year]  Reason for Call:  Transition of Care Hospital Discharge Call  Contact Status: Patient Contact Status: Complete  Language assistant needed: Interpreter Mode: Interpreter Not Needed        Follow-Up Questions: Do You Have Any Concerns About Your Health As You Heal From Delivery?: No Do You Have Any Concerns About Your Infants Health?: No  Edinburgh Postnatal Depression Scale:  In the Past 7 Days: I have been able to laugh and see the funny side of things.: As much as I always could I have looked forward with enjoyment to things.: As much as I ever did I have blamed myself unnecessarily when things went wrong.: Yes, some of the time I have been anxious or worried for no good reason.: No, not at all I have felt scared or panicky for no good reason.: No, not at all Things have been getting on top of me.: Yes, sometimes I haven't been coping as well as usual I have been so unhappy that I have had difficulty sleeping.: Not at all I have felt sad or miserable.: Not very often I have been so unhappy that I have been crying.: No, never The thought of harming myself has occurred to me.: Never Edinburgh Postnatal Depression Scale Total: 5  PHQ2-9 Depression Scale:     Discharge Follow-up: Edinburgh score requires follow up?: No Patient was advised of the following resources:: Support Group  Post-discharge interventions: Reviewed Newborn Safe Sleep Practices  Steva Banter, RN 11/27/2023 [phone]
